# Patient Record
Sex: Female | Born: 1945 | Race: White | Hispanic: No | Smoking: Never smoker
Health system: Southern US, Community
[De-identification: ages and names within clinical notes are randomized; demographics above are authoritative.]

## PROBLEM LIST (undated history)

## (undated) DIAGNOSIS — S32020A Wedge compression fracture of second lumbar vertebra, initial encounter for closed fracture: Secondary | ICD-10-CM

## (undated) DIAGNOSIS — Z889 Allergy status to unspecified drugs, medicaments and biological substances status: Secondary | ICD-10-CM

## (undated) DIAGNOSIS — J40 Bronchitis, not specified as acute or chronic: Secondary | ICD-10-CM

## (undated) DIAGNOSIS — C8590 Non-Hodgkin lymphoma, unspecified, unspecified site: Secondary | ICD-10-CM

## (undated) DIAGNOSIS — E039 Hypothyroidism, unspecified: Secondary | ICD-10-CM

## (undated) HISTORY — PX: BACK SURGERY: SHX140

## (undated) HISTORY — PX: CHOLECYSTECTOMY: SHX55

## (undated) HISTORY — PX: BREAST ENHANCEMENT SURGERY: SHX7

## (undated) HISTORY — PX: TONSILLECTOMY: SUR1361

## (undated) HISTORY — PX: OTHER SURGICAL HISTORY: SHX169

## (undated) HISTORY — PX: APPENDECTOMY: SHX54

---

## 2012-08-24 DIAGNOSIS — J310 Chronic rhinitis: Secondary | ICD-10-CM | POA: Diagnosis not present

## 2012-08-24 DIAGNOSIS — H60399 Other infective otitis externa, unspecified ear: Secondary | ICD-10-CM | POA: Diagnosis not present

## 2012-08-24 DIAGNOSIS — M542 Cervicalgia: Secondary | ICD-10-CM | POA: Diagnosis not present

## 2012-11-23 DIAGNOSIS — M9981 Other biomechanical lesions of cervical region: Secondary | ICD-10-CM | POA: Diagnosis not present

## 2012-11-23 DIAGNOSIS — M5137 Other intervertebral disc degeneration, lumbosacral region: Secondary | ICD-10-CM | POA: Diagnosis not present

## 2012-11-23 DIAGNOSIS — J019 Acute sinusitis, unspecified: Secondary | ICD-10-CM | POA: Diagnosis not present

## 2012-11-23 DIAGNOSIS — M5030 Other cervical disc degeneration, unspecified cervical region: Secondary | ICD-10-CM | POA: Diagnosis not present

## 2012-11-23 DIAGNOSIS — S239XXA Sprain of unspecified parts of thorax, initial encounter: Secondary | ICD-10-CM | POA: Diagnosis not present

## 2012-11-23 DIAGNOSIS — M999 Biomechanical lesion, unspecified: Secondary | ICD-10-CM | POA: Diagnosis not present

## 2013-01-15 DIAGNOSIS — M9981 Other biomechanical lesions of cervical region: Secondary | ICD-10-CM | POA: Diagnosis not present

## 2013-01-15 DIAGNOSIS — S239XXA Sprain of unspecified parts of thorax, initial encounter: Secondary | ICD-10-CM | POA: Diagnosis not present

## 2013-01-15 DIAGNOSIS — M5030 Other cervical disc degeneration, unspecified cervical region: Secondary | ICD-10-CM | POA: Diagnosis not present

## 2013-01-15 DIAGNOSIS — M5137 Other intervertebral disc degeneration, lumbosacral region: Secondary | ICD-10-CM | POA: Diagnosis not present

## 2013-01-15 DIAGNOSIS — M999 Biomechanical lesion, unspecified: Secondary | ICD-10-CM | POA: Diagnosis not present

## 2013-02-07 DIAGNOSIS — J0100 Acute maxillary sinusitis, unspecified: Secondary | ICD-10-CM | POA: Diagnosis not present

## 2013-02-16 DIAGNOSIS — E8881 Metabolic syndrome: Secondary | ICD-10-CM | POA: Diagnosis not present

## 2013-02-16 DIAGNOSIS — E559 Vitamin D deficiency, unspecified: Secondary | ICD-10-CM | POA: Diagnosis not present

## 2013-02-16 DIAGNOSIS — M810 Age-related osteoporosis without current pathological fracture: Secondary | ICD-10-CM | POA: Diagnosis not present

## 2013-02-16 DIAGNOSIS — E039 Hypothyroidism, unspecified: Secondary | ICD-10-CM | POA: Diagnosis not present

## 2013-02-16 DIAGNOSIS — N951 Menopausal and female climacteric states: Secondary | ICD-10-CM | POA: Diagnosis not present

## 2013-03-08 DIAGNOSIS — Z1231 Encounter for screening mammogram for malignant neoplasm of breast: Secondary | ICD-10-CM | POA: Diagnosis not present

## 2013-03-08 DIAGNOSIS — M810 Age-related osteoporosis without current pathological fracture: Secondary | ICD-10-CM | POA: Diagnosis not present

## 2013-03-08 DIAGNOSIS — E039 Hypothyroidism, unspecified: Secondary | ICD-10-CM | POA: Diagnosis not present

## 2013-03-08 DIAGNOSIS — Z978 Presence of other specified devices: Secondary | ICD-10-CM | POA: Diagnosis not present

## 2013-03-08 DIAGNOSIS — N951 Menopausal and female climacteric states: Secondary | ICD-10-CM | POA: Diagnosis not present

## 2013-03-08 DIAGNOSIS — E8881 Metabolic syndrome: Secondary | ICD-10-CM | POA: Diagnosis not present

## 2013-03-31 DIAGNOSIS — J309 Allergic rhinitis, unspecified: Secondary | ICD-10-CM | POA: Diagnosis not present

## 2013-04-27 DIAGNOSIS — E039 Hypothyroidism, unspecified: Secondary | ICD-10-CM | POA: Diagnosis not present

## 2013-05-22 DIAGNOSIS — J209 Acute bronchitis, unspecified: Secondary | ICD-10-CM | POA: Diagnosis not present

## 2013-05-22 DIAGNOSIS — H6500 Acute serous otitis media, unspecified ear: Secondary | ICD-10-CM | POA: Diagnosis not present

## 2013-05-22 DIAGNOSIS — J0100 Acute maxillary sinusitis, unspecified: Secondary | ICD-10-CM | POA: Diagnosis not present

## 2013-06-08 DIAGNOSIS — E039 Hypothyroidism, unspecified: Secondary | ICD-10-CM | POA: Diagnosis not present

## 2013-06-08 DIAGNOSIS — R5381 Other malaise: Secondary | ICD-10-CM | POA: Diagnosis not present

## 2013-06-08 DIAGNOSIS — J309 Allergic rhinitis, unspecified: Secondary | ICD-10-CM | POA: Diagnosis not present

## 2013-06-08 DIAGNOSIS — Z800 Family history of malignant neoplasm of digestive organs: Secondary | ICD-10-CM | POA: Diagnosis not present

## 2013-08-09 DIAGNOSIS — M545 Low back pain: Secondary | ICD-10-CM | POA: Diagnosis not present

## 2013-08-09 DIAGNOSIS — M542 Cervicalgia: Secondary | ICD-10-CM | POA: Diagnosis not present

## 2013-08-09 DIAGNOSIS — M2460 Ankylosis, unspecified joint: Secondary | ICD-10-CM | POA: Diagnosis not present

## 2013-08-09 DIAGNOSIS — M999 Biomechanical lesion, unspecified: Secondary | ICD-10-CM | POA: Diagnosis not present

## 2013-08-09 DIAGNOSIS — M9981 Other biomechanical lesions of cervical region: Secondary | ICD-10-CM | POA: Diagnosis not present

## 2013-08-10 DIAGNOSIS — J019 Acute sinusitis, unspecified: Secondary | ICD-10-CM | POA: Diagnosis not present

## 2013-09-18 DIAGNOSIS — M999 Biomechanical lesion, unspecified: Secondary | ICD-10-CM | POA: Diagnosis not present

## 2013-09-18 DIAGNOSIS — M5126 Other intervertebral disc displacement, lumbar region: Secondary | ICD-10-CM | POA: Diagnosis not present

## 2013-09-20 DIAGNOSIS — M999 Biomechanical lesion, unspecified: Secondary | ICD-10-CM | POA: Diagnosis not present

## 2013-09-20 DIAGNOSIS — M5126 Other intervertebral disc displacement, lumbar region: Secondary | ICD-10-CM | POA: Diagnosis not present

## 2013-10-14 DIAGNOSIS — J019 Acute sinusitis, unspecified: Secondary | ICD-10-CM | POA: Diagnosis not present

## 2013-10-19 DIAGNOSIS — R51 Headache: Secondary | ICD-10-CM | POA: Diagnosis not present

## 2013-10-19 DIAGNOSIS — J019 Acute sinusitis, unspecified: Secondary | ICD-10-CM | POA: Diagnosis not present

## 2013-10-19 DIAGNOSIS — J310 Chronic rhinitis: Secondary | ICD-10-CM | POA: Diagnosis not present

## 2013-10-19 DIAGNOSIS — J342 Deviated nasal septum: Secondary | ICD-10-CM | POA: Diagnosis not present

## 2013-11-02 DIAGNOSIS — E559 Vitamin D deficiency, unspecified: Secondary | ICD-10-CM | POA: Diagnosis not present

## 2013-11-02 DIAGNOSIS — E7800 Pure hypercholesterolemia, unspecified: Secondary | ICD-10-CM | POA: Diagnosis not present

## 2013-11-02 DIAGNOSIS — Z79899 Other long term (current) drug therapy: Secondary | ICD-10-CM | POA: Diagnosis not present

## 2013-11-02 DIAGNOSIS — E039 Hypothyroidism, unspecified: Secondary | ICD-10-CM | POA: Diagnosis not present

## 2013-11-02 DIAGNOSIS — R5381 Other malaise: Secondary | ICD-10-CM | POA: Diagnosis not present

## 2013-11-21 DIAGNOSIS — Z800 Family history of malignant neoplasm of digestive organs: Secondary | ICD-10-CM | POA: Diagnosis not present

## 2013-11-21 DIAGNOSIS — Z86010 Personal history of colonic polyps: Secondary | ICD-10-CM | POA: Diagnosis not present

## 2013-11-21 DIAGNOSIS — R1011 Right upper quadrant pain: Secondary | ICD-10-CM | POA: Diagnosis not present

## 2013-12-07 DIAGNOSIS — E039 Hypothyroidism, unspecified: Secondary | ICD-10-CM | POA: Diagnosis not present

## 2013-12-07 DIAGNOSIS — Z800 Family history of malignant neoplasm of digestive organs: Secondary | ICD-10-CM | POA: Diagnosis not present

## 2013-12-07 DIAGNOSIS — R1031 Right lower quadrant pain: Secondary | ICD-10-CM | POA: Diagnosis not present

## 2013-12-07 DIAGNOSIS — Z860100 Personal history of colon polyps, unspecified: Secondary | ICD-10-CM | POA: Diagnosis not present

## 2013-12-07 DIAGNOSIS — Z87898 Personal history of other specified conditions: Secondary | ICD-10-CM | POA: Diagnosis not present

## 2013-12-07 DIAGNOSIS — D126 Benign neoplasm of colon, unspecified: Secondary | ICD-10-CM | POA: Diagnosis not present

## 2013-12-07 DIAGNOSIS — K5900 Constipation, unspecified: Secondary | ICD-10-CM | POA: Diagnosis not present

## 2013-12-07 DIAGNOSIS — F329 Major depressive disorder, single episode, unspecified: Secondary | ICD-10-CM | POA: Diagnosis not present

## 2013-12-07 DIAGNOSIS — F3289 Other specified depressive episodes: Secondary | ICD-10-CM | POA: Diagnosis not present

## 2013-12-07 DIAGNOSIS — R5381 Other malaise: Secondary | ICD-10-CM | POA: Diagnosis not present

## 2013-12-07 DIAGNOSIS — R141 Gas pain: Secondary | ICD-10-CM | POA: Diagnosis not present

## 2013-12-07 DIAGNOSIS — Z79899 Other long term (current) drug therapy: Secondary | ICD-10-CM | POA: Diagnosis not present

## 2013-12-07 DIAGNOSIS — R635 Abnormal weight gain: Secondary | ICD-10-CM | POA: Diagnosis not present

## 2014-01-01 DIAGNOSIS — R5381 Other malaise: Secondary | ICD-10-CM | POA: Diagnosis not present

## 2014-01-01 DIAGNOSIS — E7800 Pure hypercholesterolemia, unspecified: Secondary | ICD-10-CM | POA: Diagnosis not present

## 2014-01-01 DIAGNOSIS — R5383 Other fatigue: Secondary | ICD-10-CM | POA: Diagnosis not present

## 2014-01-08 DIAGNOSIS — F331 Major depressive disorder, recurrent, moderate: Secondary | ICD-10-CM | POA: Diagnosis not present

## 2014-01-08 DIAGNOSIS — F330 Major depressive disorder, recurrent, mild: Secondary | ICD-10-CM | POA: Diagnosis not present

## 2014-01-08 DIAGNOSIS — R6250 Unspecified lack of expected normal physiological development in childhood: Secondary | ICD-10-CM | POA: Diagnosis not present

## 2014-01-08 DIAGNOSIS — F0781 Postconcussional syndrome: Secondary | ICD-10-CM | POA: Diagnosis not present

## 2014-03-22 DIAGNOSIS — S7000XA Contusion of unspecified hip, initial encounter: Secondary | ICD-10-CM | POA: Diagnosis not present

## 2014-03-22 DIAGNOSIS — N390 Urinary tract infection, site not specified: Secondary | ICD-10-CM | POA: Diagnosis not present

## 2014-04-22 DIAGNOSIS — N390 Urinary tract infection, site not specified: Secondary | ICD-10-CM | POA: Diagnosis not present

## 2014-04-22 DIAGNOSIS — N760 Acute vaginitis: Secondary | ICD-10-CM | POA: Diagnosis not present

## 2014-04-22 DIAGNOSIS — N23 Unspecified renal colic: Secondary | ICD-10-CM | POA: Diagnosis not present

## 2014-04-22 DIAGNOSIS — N200 Calculus of kidney: Secondary | ICD-10-CM | POA: Diagnosis not present

## 2014-04-22 DIAGNOSIS — R339 Retention of urine, unspecified: Secondary | ICD-10-CM | POA: Diagnosis not present

## 2014-04-29 DIAGNOSIS — N23 Unspecified renal colic: Secondary | ICD-10-CM | POA: Diagnosis not present

## 2014-04-29 DIAGNOSIS — N390 Urinary tract infection, site not specified: Secondary | ICD-10-CM | POA: Diagnosis not present

## 2014-05-13 DIAGNOSIS — R339 Retention of urine, unspecified: Secondary | ICD-10-CM | POA: Diagnosis not present

## 2014-05-13 DIAGNOSIS — N23 Unspecified renal colic: Secondary | ICD-10-CM | POA: Diagnosis not present

## 2014-05-13 DIAGNOSIS — N760 Acute vaginitis: Secondary | ICD-10-CM | POA: Diagnosis not present

## 2014-05-13 DIAGNOSIS — N390 Urinary tract infection, site not specified: Secondary | ICD-10-CM | POA: Diagnosis not present

## 2014-05-13 DIAGNOSIS — R319 Hematuria, unspecified: Secondary | ICD-10-CM | POA: Diagnosis not present

## 2014-05-13 DIAGNOSIS — N200 Calculus of kidney: Secondary | ICD-10-CM | POA: Diagnosis not present

## 2014-05-14 DIAGNOSIS — R5381 Other malaise: Secondary | ICD-10-CM | POA: Diagnosis not present

## 2014-05-14 DIAGNOSIS — M549 Dorsalgia, unspecified: Secondary | ICD-10-CM | POA: Diagnosis not present

## 2014-05-14 DIAGNOSIS — R5383 Other fatigue: Secondary | ICD-10-CM | POA: Diagnosis not present

## 2014-05-14 DIAGNOSIS — E039 Hypothyroidism, unspecified: Secondary | ICD-10-CM | POA: Diagnosis not present

## 2014-05-27 DIAGNOSIS — N35919 Unspecified urethral stricture, male, unspecified site: Secondary | ICD-10-CM | POA: Diagnosis not present

## 2014-05-27 DIAGNOSIS — N362 Urethral caruncle: Secondary | ICD-10-CM | POA: Diagnosis not present

## 2014-05-27 DIAGNOSIS — N390 Urinary tract infection, site not specified: Secondary | ICD-10-CM | POA: Diagnosis not present

## 2014-05-27 DIAGNOSIS — R339 Retention of urine, unspecified: Secondary | ICD-10-CM | POA: Diagnosis not present

## 2014-05-27 DIAGNOSIS — R319 Hematuria, unspecified: Secondary | ICD-10-CM | POA: Diagnosis not present

## 2014-05-27 DIAGNOSIS — R310 Gross hematuria: Secondary | ICD-10-CM | POA: Diagnosis not present

## 2014-06-17 DIAGNOSIS — T148 Other injury of unspecified body region: Secondary | ICD-10-CM | POA: Diagnosis not present

## 2014-06-17 DIAGNOSIS — M25559 Pain in unspecified hip: Secondary | ICD-10-CM | POA: Diagnosis not present

## 2014-06-17 DIAGNOSIS — E039 Hypothyroidism, unspecified: Secondary | ICD-10-CM | POA: Diagnosis not present

## 2014-06-17 DIAGNOSIS — N390 Urinary tract infection, site not specified: Secondary | ICD-10-CM | POA: Diagnosis not present

## 2014-06-17 DIAGNOSIS — K5900 Constipation, unspecified: Secondary | ICD-10-CM | POA: Diagnosis not present

## 2014-06-17 DIAGNOSIS — Z23 Encounter for immunization: Secondary | ICD-10-CM | POA: Diagnosis not present

## 2014-06-17 DIAGNOSIS — E278 Other specified disorders of adrenal gland: Secondary | ICD-10-CM | POA: Diagnosis not present

## 2014-07-16 DIAGNOSIS — H02423 Myogenic ptosis of bilateral eyelids: Secondary | ICD-10-CM | POA: Diagnosis not present

## 2014-07-16 DIAGNOSIS — H02831 Dermatochalasis of right upper eyelid: Secondary | ICD-10-CM | POA: Diagnosis not present

## 2014-07-16 DIAGNOSIS — H02834 Dermatochalasis of left upper eyelid: Secondary | ICD-10-CM | POA: Diagnosis not present

## 2014-08-13 DIAGNOSIS — N390 Urinary tract infection, site not specified: Secondary | ICD-10-CM | POA: Diagnosis not present

## 2014-09-12 DIAGNOSIS — H02834 Dermatochalasis of left upper eyelid: Secondary | ICD-10-CM | POA: Diagnosis not present

## 2014-09-12 DIAGNOSIS — H01116 Allergic dermatitis of left eye, unspecified eyelid: Secondary | ICD-10-CM | POA: Diagnosis not present

## 2014-09-12 DIAGNOSIS — H02831 Dermatochalasis of right upper eyelid: Secondary | ICD-10-CM | POA: Diagnosis not present

## 2014-09-12 DIAGNOSIS — H01113 Allergic dermatitis of right eye, unspecified eyelid: Secondary | ICD-10-CM | POA: Diagnosis not present

## 2014-09-17 DIAGNOSIS — N760 Acute vaginitis: Secondary | ICD-10-CM | POA: Diagnosis not present

## 2014-09-17 DIAGNOSIS — R3914 Feeling of incomplete bladder emptying: Secondary | ICD-10-CM | POA: Diagnosis not present

## 2014-09-17 DIAGNOSIS — N390 Urinary tract infection, site not specified: Secondary | ICD-10-CM | POA: Diagnosis not present

## 2014-09-17 DIAGNOSIS — N23 Unspecified renal colic: Secondary | ICD-10-CM | POA: Diagnosis not present

## 2014-09-17 DIAGNOSIS — N200 Calculus of kidney: Secondary | ICD-10-CM | POA: Diagnosis not present

## 2014-09-17 DIAGNOSIS — Q6101 Congenital single renal cyst: Secondary | ICD-10-CM | POA: Diagnosis not present

## 2014-11-11 DIAGNOSIS — H02831 Dermatochalasis of right upper eyelid: Secondary | ICD-10-CM | POA: Diagnosis not present

## 2014-11-11 DIAGNOSIS — H02834 Dermatochalasis of left upper eyelid: Secondary | ICD-10-CM | POA: Diagnosis not present

## 2014-12-04 DIAGNOSIS — N390 Urinary tract infection, site not specified: Secondary | ICD-10-CM | POA: Diagnosis not present

## 2015-01-13 DIAGNOSIS — M25551 Pain in right hip: Secondary | ICD-10-CM | POA: Diagnosis not present

## 2015-01-13 DIAGNOSIS — N760 Acute vaginitis: Secondary | ICD-10-CM | POA: Diagnosis not present

## 2015-01-13 DIAGNOSIS — N390 Urinary tract infection, site not specified: Secondary | ICD-10-CM | POA: Diagnosis not present

## 2015-01-13 DIAGNOSIS — R3914 Feeling of incomplete bladder emptying: Secondary | ICD-10-CM | POA: Diagnosis not present

## 2015-01-13 DIAGNOSIS — R319 Hematuria, unspecified: Secondary | ICD-10-CM | POA: Diagnosis not present

## 2015-01-13 DIAGNOSIS — N200 Calculus of kidney: Secondary | ICD-10-CM | POA: Diagnosis not present

## 2015-03-11 DIAGNOSIS — E039 Hypothyroidism, unspecified: Secondary | ICD-10-CM | POA: Diagnosis not present

## 2015-03-11 DIAGNOSIS — Z23 Encounter for immunization: Secondary | ICD-10-CM | POA: Diagnosis not present

## 2015-03-11 DIAGNOSIS — M25559 Pain in unspecified hip: Secondary | ICD-10-CM | POA: Diagnosis not present

## 2015-03-11 DIAGNOSIS — Z136 Encounter for screening for cardiovascular disorders: Secondary | ICD-10-CM | POA: Diagnosis not present

## 2015-03-11 DIAGNOSIS — L298 Other pruritus: Secondary | ICD-10-CM | POA: Diagnosis not present

## 2015-03-11 DIAGNOSIS — Z0000 Encounter for general adult medical examination without abnormal findings: Secondary | ICD-10-CM | POA: Diagnosis not present

## 2015-03-11 DIAGNOSIS — F329 Major depressive disorder, single episode, unspecified: Secondary | ICD-10-CM | POA: Diagnosis not present

## 2015-04-23 DIAGNOSIS — J019 Acute sinusitis, unspecified: Secondary | ICD-10-CM | POA: Diagnosis not present

## 2015-04-23 DIAGNOSIS — H65199 Other acute nonsuppurative otitis media, unspecified ear: Secondary | ICD-10-CM | POA: Diagnosis not present

## 2015-09-03 DIAGNOSIS — G4700 Insomnia, unspecified: Secondary | ICD-10-CM | POA: Diagnosis not present

## 2015-09-03 DIAGNOSIS — L821 Other seborrheic keratosis: Secondary | ICD-10-CM | POA: Diagnosis not present

## 2015-09-03 DIAGNOSIS — E039 Hypothyroidism, unspecified: Secondary | ICD-10-CM | POA: Diagnosis not present

## 2015-09-03 DIAGNOSIS — F329 Major depressive disorder, single episode, unspecified: Secondary | ICD-10-CM | POA: Diagnosis not present

## 2015-09-16 DIAGNOSIS — J019 Acute sinusitis, unspecified: Secondary | ICD-10-CM | POA: Diagnosis not present

## 2015-09-16 DIAGNOSIS — J209 Acute bronchitis, unspecified: Secondary | ICD-10-CM | POA: Diagnosis not present

## 2016-02-27 DIAGNOSIS — M542 Cervicalgia: Secondary | ICD-10-CM | POA: Diagnosis not present

## 2016-02-27 DIAGNOSIS — M549 Dorsalgia, unspecified: Secondary | ICD-10-CM | POA: Diagnosis not present

## 2016-02-27 DIAGNOSIS — M545 Low back pain: Secondary | ICD-10-CM | POA: Diagnosis not present

## 2016-02-28 DIAGNOSIS — M549 Dorsalgia, unspecified: Secondary | ICD-10-CM | POA: Diagnosis not present

## 2016-03-03 DIAGNOSIS — M545 Low back pain: Secondary | ICD-10-CM | POA: Diagnosis not present

## 2016-03-06 DIAGNOSIS — M545 Low back pain: Secondary | ICD-10-CM | POA: Diagnosis not present

## 2016-03-17 DIAGNOSIS — R0602 Shortness of breath: Secondary | ICD-10-CM | POA: Diagnosis not present

## 2016-03-17 DIAGNOSIS — J209 Acute bronchitis, unspecified: Secondary | ICD-10-CM | POA: Diagnosis not present

## 2016-03-17 DIAGNOSIS — I4892 Unspecified atrial flutter: Secondary | ICD-10-CM | POA: Diagnosis not present

## 2016-03-22 DIAGNOSIS — M545 Low back pain: Secondary | ICD-10-CM | POA: Diagnosis not present

## 2016-03-24 DIAGNOSIS — E039 Hypothyroidism, unspecified: Secondary | ICD-10-CM | POA: Diagnosis not present

## 2016-03-24 DIAGNOSIS — F329 Major depressive disorder, single episode, unspecified: Secondary | ICD-10-CM | POA: Diagnosis not present

## 2016-03-24 DIAGNOSIS — J329 Chronic sinusitis, unspecified: Secondary | ICD-10-CM | POA: Diagnosis not present

## 2016-05-04 DIAGNOSIS — M545 Low back pain: Secondary | ICD-10-CM | POA: Diagnosis not present

## 2016-05-06 ENCOUNTER — Other Ambulatory Visit: Payer: Self-pay

## 2016-05-10 DIAGNOSIS — H6691 Otitis media, unspecified, right ear: Secondary | ICD-10-CM | POA: Diagnosis not present

## 2016-05-10 DIAGNOSIS — J209 Acute bronchitis, unspecified: Secondary | ICD-10-CM | POA: Diagnosis not present

## 2016-05-10 DIAGNOSIS — J019 Acute sinusitis, unspecified: Secondary | ICD-10-CM | POA: Diagnosis not present

## 2016-05-24 DIAGNOSIS — M545 Low back pain: Secondary | ICD-10-CM | POA: Diagnosis not present

## 2016-06-01 ENCOUNTER — Other Ambulatory Visit: Payer: Self-pay | Admitting: Orthopedic Surgery

## 2016-06-02 ENCOUNTER — Encounter (HOSPITAL_COMMUNITY): Payer: Self-pay | Admitting: *Deleted

## 2016-06-02 NOTE — Progress Notes (Signed)
Pt denies SOB, chest pain, and being under the care of a cardiologist. Pt denies having a cardiac cath and stress test but stated that an echo was performed 20 years ago. Pt stated that an EKG and chest x ray was done at [facility] in [place], [place]; records requested. Pt denies having any recent labs. Pt made aware to stop  taking Aspirin, vitamins, fish oil and herbal medications. Do not take any NSAIDs ie: Ibuprofen, Advil, Naproxen, BC and Goody Powder or any medication containing Aspirin. Pt verbalized understanding of all pre-op instructions.

## 2016-06-03 ENCOUNTER — Encounter (HOSPITAL_COMMUNITY): Payer: Self-pay | Admitting: *Deleted

## 2016-06-03 ENCOUNTER — Ambulatory Visit (HOSPITAL_COMMUNITY): Payer: Medicare Other

## 2016-06-03 ENCOUNTER — Encounter (HOSPITAL_COMMUNITY)
Admission: RE | Disposition: A | Discharge status: Home / Self Care | Payer: Self-pay | Source: Ambulatory Visit | Attending: Orthopedic Surgery

## 2016-06-03 ENCOUNTER — Ambulatory Visit (HOSPITAL_COMMUNITY): Payer: Medicare Other | Admitting: Anesthesiology

## 2016-06-03 ENCOUNTER — Ambulatory Visit (HOSPITAL_COMMUNITY)
Admission: RE | Admit: 2016-06-03 | Discharge: 2016-06-03 | Disposition: A | Discharge status: Home / Self Care | Payer: Medicare Other | Source: Ambulatory Visit | Attending: Orthopedic Surgery | Admitting: Orthopedic Surgery

## 2016-06-03 DIAGNOSIS — E039 Hypothyroidism, unspecified: Secondary | ICD-10-CM | POA: Insufficient documentation

## 2016-06-03 DIAGNOSIS — S32020A Wedge compression fracture of second lumbar vertebra, initial encounter for closed fracture: Secondary | ICD-10-CM | POA: Diagnosis not present

## 2016-06-03 DIAGNOSIS — W19XXXA Unspecified fall, initial encounter: Secondary | ICD-10-CM | POA: Diagnosis not present

## 2016-06-03 DIAGNOSIS — Z825 Family history of asthma and other chronic lower respiratory diseases: Secondary | ICD-10-CM | POA: Insufficient documentation

## 2016-06-03 DIAGNOSIS — Z833 Family history of diabetes mellitus: Secondary | ICD-10-CM | POA: Diagnosis not present

## 2016-06-03 DIAGNOSIS — Z8572 Personal history of non-Hodgkin lymphomas: Secondary | ICD-10-CM | POA: Diagnosis not present

## 2016-06-03 DIAGNOSIS — Z419 Encounter for procedure for purposes other than remedying health state, unspecified: Secondary | ICD-10-CM

## 2016-06-03 DIAGNOSIS — Z01818 Encounter for other preprocedural examination: Secondary | ICD-10-CM

## 2016-06-03 DIAGNOSIS — C8590 Non-Hodgkin lymphoma, unspecified, unspecified site: Secondary | ICD-10-CM | POA: Diagnosis not present

## 2016-06-03 DIAGNOSIS — S32028A Other fracture of second lumbar vertebra, initial encounter for closed fracture: Secondary | ICD-10-CM | POA: Diagnosis not present

## 2016-06-03 DIAGNOSIS — Z462 Encounter for fitting and adjustment of other devices related to nervous system and special senses: Secondary | ICD-10-CM | POA: Diagnosis not present

## 2016-06-03 HISTORY — DX: Hypothyroidism, unspecified: E03.9

## 2016-06-03 HISTORY — DX: Non-Hodgkin lymphoma, unspecified, unspecified site: C85.90

## 2016-06-03 HISTORY — PX: KYPHOPLASTY: SHX5884

## 2016-06-03 HISTORY — DX: Bronchitis, not specified as acute or chronic: J40

## 2016-06-03 HISTORY — DX: Wedge compression fracture of second lumbar vertebra, initial encounter for closed fracture: S32.020A

## 2016-06-03 HISTORY — DX: Allergy status to unspecified drugs, medicaments and biological substances: Z88.9

## 2016-06-03 LAB — COMPREHENSIVE METABOLIC PANEL
ALT: 15 U/L (ref 14–54)
AST: 19 U/L (ref 15–41)
Albumin: 3.8 g/dL (ref 3.5–5.0)
Alkaline Phosphatase: 59 U/L (ref 38–126)
Anion gap: 7 (ref 5–15)
BUN: 14 mg/dL (ref 6–20)
CHLORIDE: 110 mmol/L (ref 101–111)
CO2: 24 mmol/L (ref 22–32)
Calcium: 9.3 mg/dL (ref 8.9–10.3)
Creatinine, Ser: 0.74 mg/dL (ref 0.44–1.00)
Glucose, Bld: 99 mg/dL (ref 65–99)
POTASSIUM: 4.3 mmol/L (ref 3.5–5.1)
SODIUM: 141 mmol/L (ref 135–145)
Total Bilirubin: 0.8 mg/dL (ref 0.3–1.2)
Total Protein: 6.4 g/dL — ABNORMAL LOW (ref 6.5–8.1)

## 2016-06-03 LAB — CBC WITH DIFFERENTIAL/PLATELET
Basophils Absolute: 0 10*3/uL (ref 0.0–0.1)
Basophils Relative: 1 %
EOS ABS: 0.2 10*3/uL (ref 0.0–0.7)
EOS PCT: 4 %
HCT: 37.5 % (ref 36.0–46.0)
HEMOGLOBIN: 12.7 g/dL (ref 12.0–15.0)
LYMPHS ABS: 2.2 10*3/uL (ref 0.7–4.0)
Lymphocytes Relative: 39 %
MCH: 32.6 pg (ref 26.0–34.0)
MCHC: 33.9 g/dL (ref 30.0–36.0)
MCV: 96.4 fL (ref 78.0–100.0)
MONO ABS: 0.5 10*3/uL (ref 0.1–1.0)
MONOS PCT: 9 %
Neutro Abs: 2.8 10*3/uL (ref 1.7–7.7)
Neutrophils Relative %: 47 %
PLATELETS: 264 10*3/uL (ref 150–400)
RBC: 3.89 MIL/uL (ref 3.87–5.11)
RDW: 12.2 % (ref 11.5–15.5)
WBC: 5.8 10*3/uL (ref 4.0–10.5)

## 2016-06-03 LAB — URINALYSIS, ROUTINE W REFLEX MICROSCOPIC
BILIRUBIN URINE: NEGATIVE
GLUCOSE, UA: NEGATIVE mg/dL
HGB URINE DIPSTICK: NEGATIVE
KETONES UR: 15 mg/dL — AB
Leukocytes, UA: NEGATIVE
Nitrite: NEGATIVE
PH: 7.5 (ref 5.0–8.0)
Protein, ur: NEGATIVE mg/dL
Specific Gravity, Urine: 1.025 (ref 1.005–1.030)

## 2016-06-03 LAB — PROTIME-INR
INR: 0.99
PROTHROMBIN TIME: 13.1 s (ref 11.4–15.2)

## 2016-06-03 LAB — APTT: aPTT: 30 seconds (ref 24–36)

## 2016-06-03 SURGERY — KYPHOPLASTY
Anesthesia: General

## 2016-06-03 MED ORDER — SUGAMMADEX SODIUM 200 MG/2ML IV SOLN
INTRAVENOUS | Status: AC
  Filled 2016-06-03: qty 2

## 2016-06-03 MED ORDER — BACITRACIN 500 UNIT/GM EX OINT
TOPICAL_OINTMENT | CUTANEOUS | Status: DC | PRN
  Administered 2016-06-03: 1 via TOPICAL

## 2016-06-03 MED ORDER — IOPAMIDOL (ISOVUE-300) INJECTION 61%
INTRAVENOUS | Status: AC
  Filled 2016-06-03: qty 50

## 2016-06-03 MED ORDER — LIDOCAINE HCL (CARDIAC) 20 MG/ML IV SOLN
INTRAVENOUS | Status: DC | PRN
  Administered 2016-06-03: 100 mg via INTRAVENOUS

## 2016-06-03 MED ORDER — FENTANYL CITRATE (PF) 100 MCG/2ML IJ SOLN
INTRAMUSCULAR | Status: DC | PRN
  Administered 2016-06-03 (×2): 50 ug via INTRAVENOUS

## 2016-06-03 MED ORDER — ROCURONIUM BROMIDE 10 MG/ML (PF) SYRINGE
PREFILLED_SYRINGE | INTRAVENOUS | Status: AC
  Filled 2016-06-03: qty 10

## 2016-06-03 MED ORDER — ACETAMINOPHEN 160 MG/5ML PO SOLN
325.0000 mg | ORAL | Status: DC | PRN
Start: 2016-06-03 — End: 2016-06-03
  Filled 2016-06-03: qty 20.3

## 2016-06-03 MED ORDER — SUGAMMADEX SODIUM 200 MG/2ML IV SOLN
INTRAVENOUS | Status: DC | PRN
  Administered 2016-06-03: 145.2 mg via INTRAVENOUS

## 2016-06-03 MED ORDER — BUPIVACAINE-EPINEPHRINE 0.25% -1:200000 IJ SOLN: INTRAMUSCULAR | Status: DC | PRN

## 2016-06-03 MED ORDER — ACETAMINOPHEN 325 MG PO TABS
325.0000 mg | ORAL_TABLET | ORAL | Status: DC | PRN
Start: 2016-06-03 — End: 2016-06-03

## 2016-06-03 MED ORDER — OXYCODONE HCL 5 MG/5ML PO SOLN: 5.0000 mg | Freq: Once | ORAL | Status: DC | PRN

## 2016-06-03 MED ORDER — CEFAZOLIN SODIUM-DEXTROSE 2-4 GM/100ML-% IV SOLN
INTRAVENOUS | Status: AC
  Filled 2016-06-03: qty 100

## 2016-06-03 MED ORDER — OXYCODONE HCL 5 MG PO TABS: 5.0000 mg | ORAL_TABLET | Freq: Once | ORAL | Status: DC | PRN

## 2016-06-03 MED ORDER — ONDANSETRON HCL 4 MG/2ML IJ SOLN
INTRAMUSCULAR | Status: DC | PRN
  Administered 2016-06-03: 4 mg via INTRAVENOUS

## 2016-06-03 MED ORDER — ARTIFICIAL TEARS OP OINT
TOPICAL_OINTMENT | OPHTHALMIC | Status: DC | PRN
  Administered 2016-06-03: 1 via OPHTHALMIC

## 2016-06-03 MED ORDER — PROPOFOL 10 MG/ML IV BOLUS
INTRAVENOUS | Status: DC | PRN
  Administered 2016-06-03: 150 mg via INTRAVENOUS

## 2016-06-03 MED ORDER — HYDROMORPHONE HCL 1 MG/ML IJ SOLN
INTRAMUSCULAR | Status: AC
  Administered 2016-06-03: 0.5 mg via INTRAVENOUS
  Filled 2016-06-03: qty 1

## 2016-06-03 MED ORDER — FENTANYL CITRATE (PF) 100 MCG/2ML IJ SOLN
INTRAMUSCULAR | Status: AC
  Filled 2016-06-03: qty 2

## 2016-06-03 MED ORDER — IOPAMIDOL (ISOVUE-300) INJECTION 61%
INTRAVENOUS | Status: DC | PRN
  Administered 2016-06-03: 30 mL

## 2016-06-03 MED ORDER — FENTANYL CITRATE (PF) 100 MCG/2ML IJ SOLN: 25.0000 ug | INTRAMUSCULAR | Status: DC | PRN

## 2016-06-03 MED ORDER — ONDANSETRON HCL 4 MG/2ML IJ SOLN
INTRAMUSCULAR | Status: AC
  Filled 2016-06-03: qty 2

## 2016-06-03 MED ORDER — LACTATED RINGERS IV SOLN
INTRAVENOUS | Status: DC
  Administered 2016-06-03 (×2): via INTRAVENOUS

## 2016-06-03 MED ORDER — BUPIVACAINE-EPINEPHRINE 0.25% -1:200000 IJ SOLN
INTRAMUSCULAR | Status: AC
  Filled 2016-06-03: qty 1

## 2016-06-03 MED ORDER — CEFAZOLIN SODIUM-DEXTROSE 2-4 GM/100ML-% IV SOLN
2.0000 g | INTRAVENOUS | Status: AC
  Administered 2016-06-03: 2 g via INTRAVENOUS

## 2016-06-03 MED ORDER — MIDAZOLAM HCL 5 MG/5ML IJ SOLN
INTRAMUSCULAR | Status: DC | PRN
  Administered 2016-06-03: 2 mg via INTRAVENOUS

## 2016-06-03 MED ORDER — BACITRACIN ZINC 500 UNIT/GM EX OINT
TOPICAL_OINTMENT | CUTANEOUS | Status: AC
  Filled 2016-06-03: qty 28.35

## 2016-06-03 MED ORDER — 0.9 % SODIUM CHLORIDE (POUR BTL) OPTIME
TOPICAL | Status: DC | PRN
  Administered 2016-06-03: 1000 mL

## 2016-06-03 MED ORDER — HYDROMORPHONE HCL 1 MG/ML IJ SOLN
0.5000 mg | INTRAMUSCULAR | Status: AC | PRN
  Administered 2016-06-03 (×4): 0.5 mg via INTRAVENOUS

## 2016-06-03 MED ORDER — PHENYLEPHRINE HCL 10 MG/ML IJ SOLN
INTRAMUSCULAR | Status: DC | PRN
  Administered 2016-06-03 (×3): 40 ug via INTRAVENOUS

## 2016-06-03 MED ORDER — ROCURONIUM BROMIDE 100 MG/10ML IV SOLN
INTRAVENOUS | Status: DC | PRN
  Administered 2016-06-03: 50 mg via INTRAVENOUS

## 2016-06-03 MED ORDER — PROPOFOL 10 MG/ML IV BOLUS
INTRAVENOUS | Status: AC
  Filled 2016-06-03: qty 20

## 2016-06-03 MED ORDER — LIDOCAINE 2% (20 MG/ML) 5 ML SYRINGE
INTRAMUSCULAR | Status: AC
  Filled 2016-06-03: qty 5

## 2016-06-03 MED ORDER — POVIDONE-IODINE 7.5 % EX SOLN
Freq: Once | CUTANEOUS | Status: DC
  Filled 2016-06-03: qty 118

## 2016-06-03 SURGICAL SUPPLY — 42 items
BLADE SURG 15 STRL LF DISP TIS (BLADE) ×1 IMPLANT
BLADE SURG 15 STRL SS (BLADE) ×1
CEMENT BONE KYPHX HV R (Orthopedic Implant) ×2 IMPLANT
CEMENT KYPHON C01A KIT/MIXER (Cement) ×2 IMPLANT
COVER MAYO STAND STRL (DRAPES) ×2 IMPLANT
COVER SURGICAL LIGHT HANDLE (MISCELLANEOUS) ×2 IMPLANT
CURETTE EXPRESS SZ2 7MM (INSTRUMENTS) ×1 IMPLANT
CURRETTE EXPRESS SZ2 7MM (INSTRUMENTS) ×2
DRAPE C-ARM 42X72 X-RAY (DRAPES) ×2 IMPLANT
DRAPE INCISE IOBAN 66X45 STRL (DRAPES) ×2 IMPLANT
DRAPE LAPAROTOMY T 102X78X121 (DRAPES) ×2 IMPLANT
DRAPE PROXIMA HALF (DRAPES) IMPLANT
DRAPE SURG 17X23 STRL (DRAPES) ×8 IMPLANT
DURAPREP 26ML APPLICATOR (WOUND CARE) ×2 IMPLANT
GAUZE SPONGE 4X4 16PLY XRAY LF (GAUZE/BANDAGES/DRESSINGS) ×2 IMPLANT
GLOVE BIO SURGEON STRL SZ7 (GLOVE) ×2 IMPLANT
GLOVE BIO SURGEON STRL SZ8 (GLOVE) ×2 IMPLANT
GLOVE BIOGEL PI IND STRL 7.0 (GLOVE) ×1 IMPLANT
GLOVE BIOGEL PI IND STRL 8 (GLOVE) ×1 IMPLANT
GLOVE BIOGEL PI INDICATOR 7.0 (GLOVE) ×1
GLOVE BIOGEL PI INDICATOR 8 (GLOVE) ×1
GOWN STRL REUS W/ TWL LRG LVL3 (GOWN DISPOSABLE) ×2 IMPLANT
GOWN STRL REUS W/ TWL XL LVL3 (GOWN DISPOSABLE) ×1 IMPLANT
GOWN STRL REUS W/TWL LRG LVL3 (GOWN DISPOSABLE) ×2
GOWN STRL REUS W/TWL XL LVL3 (GOWN DISPOSABLE) ×1
KIT BASIN OR (CUSTOM PROCEDURE TRAY) ×2 IMPLANT
KIT ROOM TURNOVER OR (KITS) ×2 IMPLANT
NEEDLE 22X1 1/2 (OR ONLY) (NEEDLE) IMPLANT
NEEDLE HYPO 25X1 1.5 SAFETY (NEEDLE) IMPLANT
NEEDLE SPNL 18GX3.5 QUINCKE PK (NEEDLE) ×6 IMPLANT
NS IRRIG 1000ML POUR BTL (IV SOLUTION) ×2 IMPLANT
PACK SURGICAL SETUP 50X90 (CUSTOM PROCEDURE TRAY) ×2 IMPLANT
PAD ARMBOARD 7.5X6 YLW CONV (MISCELLANEOUS) ×4 IMPLANT
POSITIONER HEAD PRONE TRACH (MISCELLANEOUS) ×2 IMPLANT
SPONGE GAUZE 4X4 12PLY STER LF (GAUZE/BANDAGES/DRESSINGS) ×2 IMPLANT
SUT MNCRL AB 4-0 PS2 18 (SUTURE) ×2 IMPLANT
SYR BULB IRRIGATION 50ML (SYRINGE) ×2 IMPLANT
SYR CONTROL 10ML LL (SYRINGE) ×2 IMPLANT
TAPE CLOTH SURG 4X10 WHT LF (GAUZE/BANDAGES/DRESSINGS) ×2 IMPLANT
TOWEL OR 17X24 6PK STRL BLUE (TOWEL DISPOSABLE) ×2 IMPLANT
TOWEL OR 17X26 10 PK STRL BLUE (TOWEL DISPOSABLE) ×2 IMPLANT
TRAY KYPHOPAK 15/3 ONESTEP 1ST (MISCELLANEOUS) IMPLANT

## 2016-06-03 NOTE — Anesthesia Procedure Notes (Signed)
Procedure Name: Intubation Date/Time: 06/03/2016 12:31 PM Performed by: Scheryl Darter Pre-anesthesia Checklist: Patient identified, Emergency Drugs available, Suction available and Patient being monitored Patient Re-evaluated:Patient Re-evaluated prior to inductionOxygen Delivery Method: Circle System Utilized Preoxygenation: Pre-oxygenation with 100% oxygen Intubation Type: IV induction Ventilation: Mask ventilation without difficulty Laryngoscope Size: Miller and 2 Grade View: Grade I Tube type: Oral Tube size: 7.5 mm Number of attempts: 1 Airway Equipment and Method: Stylet and Oral airway Placement Confirmation: ETT inserted through vocal cords under direct vision,  positive ETCO2 and breath sounds checked- equal and bilateral Secured at: 21 cm Tube secured with: Tape Dental Injury: Teeth and Oropharynx as per pre-operative assessment  Comments: Teeth unchanged

## 2016-06-03 NOTE — H&P (Signed)
     PREOPERATIVE H&P  Chief Complaint: Low back pain  HPI: Shelly Romero is a 70 y.o. female who presents with ongoing pain in the low back x 3 months  MRI reveals edema at the L2 vertebral body  Patient has failed multiple forms of conservative care and continues to have pain (see office notes for additional details regarding the patient's full course of treatment)  Past Medical History:  Diagnosis Date  . Bronchitis   . Compression fracture of L2 lumbar vertebra (HCC)   . History of seasonal allergies   . Hypothyroidism   . Non Hodgkin's lymphoma [facility])    Past Surgical History:  Procedure Laterality Date  . APPENDECTOMY    . BACK SURGERY     cervical  . BREAST ENHANCEMENT SURGERY    . chin lift    . CHOLECYSTECTOMY    . ovary excision     due to Non-hodgkins lymphoma  . TONSILLECTOMY     Social History   Social History  . Marital status: Divorced    Spouse name: N/A  . Number of children: N/A  . Years of education: N/A   Social History Main Topics  . Smoking status: Never Smoker  . Smokeless tobacco: Never Used  . Alcohol use Yes     Comment: occasional glass of wine ( 3 days a  week)  . Drug use: No  . Sexual activity: Not Asked   Other Topics Concern  . None   Social History Narrative  . None   Family History  Problem Relation Age of Onset  . Diabetes Mother   . COPD Sister   . COPD Brother   . Cancer Other    Allergies  Allergen Reactions  . Gluten Meal     Fatigue/flu-like symptoms.   Prior to Admission medications   Medication Sig Start Date End Date Taking? Authorizing Provider  ARMOUR THYROID 30 MG tablet Take 30 mg by mouth daily before breakfast. 04/09/16  Yes Historical Provider, MD  b complex vitamins capsule Take 1 capsule by mouth daily.   Yes Historical Provider, MD  CALCIUM-MAGNESIUM PO Take 1 tablet by mouth every evening.   Yes Historical Provider, MD  cholecalciferol (VITAMIN D) 1000 units tablet Take 1,000 Units by mouth  daily.   Yes Historical Provider, MD  citalopram (CELEXA) 10 MG tablet Take 10 mg by mouth every evening. 03/28/16  Yes Historical Provider, MD  Multiple Vitamin (MULTIVITAMIN WITH MINERALS) TABS tablet Take 1 tablet by mouth daily.   Yes Historical Provider, MD  tetrahydrozoline 0.05 % ophthalmic solution Place 1-2 drops into both eyes 2 (two) times daily as needed (for eye redness/irritation.).   Yes Historical Provider, MD  traMADol (ULTRAM) 50 MG tablet Take 50 mg by mouth 3 (three) times daily as needed. For pain. 05/27/16  Yes Historical Provider, MD     All other systems have been reviewed and were otherwise negative with the exception of those mentioned in the HPI and as above.  Physical Exam: There were no vitals filed for this visit.  General: Alert, no acute distress Cardiovascular: No pedal edema Respiratory: No cyanosis, no use of accessory musculature Skin: No lesions in the area of chief complaint Neurologic: Sensation intact distally Psychiatric: Patient is competent for consent with normal mood and affect Lymphatic: No axillary or cervical lymphadenopathy  MUSCULOSKELETAL: + TTP at L2  Assessment/Plan: Lumbar 2 compression fracture Plan for Procedure(s): L2 KYPHOPLASTY   Sinclair Ship, MD 06/03/2016 7:02 AM

## 2016-06-03 NOTE — Transfer of Care (Signed)
Immediate Anesthesia Transfer of Care Note  Patient: Shelly Romero  Procedure(s) Performed: Procedure(s) with comments: L 2 KYPHOPLASTY (N/A) - L 2 kyphoplasty  Patient Location: PACU  Anesthesia Type:General  Level of Consciousness: awake, alert , oriented and sedated  Airway & Oxygen Therapy: Patient Spontanous Breathing and Patient connected to nasal cannula oxygen  Post-op Assessment: Report given to RN, Post -op Vital signs reviewed and stable and Patient moving all extremities  Post vital signs: Reviewed and stable  Last Vitals:  Vitals:   06/03/16 0914 06/03/16 1354  BP: (!) 105/41   Pulse: 73   Resp: 18   Temp: 36.7 C (P) 36.4 C    Last Pain:  Vitals:   06/03/16 0925  PainSc: 4       Patients Stated Pain Goal: 1 ([id number])  Complications: No apparent anesthesia complications

## 2016-06-03 NOTE — Anesthesia Postprocedure Evaluation (Signed)
Anesthesia Post Note  Patient: Shelly Romero  Procedure(s) Performed: Procedure(s) (LRB): L 2 KYPHOPLASTY (N/A)  Patient location during evaluation: PACU Anesthesia Type: General Level of consciousness: awake and alert Pain management: pain level controlled Vital Signs Assessment: post-procedure vital signs reviewed and stable Respiratory status: spontaneous breathing, nonlabored ventilation and respiratory function stable Cardiovascular status: blood pressure returned to baseline and stable Postop Assessment: no signs of nausea or vomiting Anesthetic complications: no    Last Vitals:  Vitals:   06/03/16 1530 06/03/16 1555  BP:  99/66  Pulse: 61 62  Resp: 20   Temp:      Last Pain:  Vitals:   06/03/16 1555  PainSc: 2                  Janis Cuffe A

## 2016-06-04 ENCOUNTER — Encounter (HOSPITAL_COMMUNITY): Payer: Self-pay | Admitting: Orthopedic Surgery

## 2016-06-04 NOTE — Anesthesia Postprocedure Evaluation (Signed)
Anesthesia Post Note  Patient: [name]  Procedure(s) Performed: Procedure(s) (LRB): L 2 KYPHOPLASTY (N/A)  Patient location during evaluation: PACU Anesthesia Type: General Level of consciousness: awake and alert Pain management: pain level controlled Vital Signs Assessment: post-procedure vital signs reviewed and stable Respiratory status: spontaneous breathing, nonlabored ventilation, respiratory function stable and patient connected to nasal cannula oxygen Cardiovascular status: blood pressure returned to baseline and stable Postop Assessment: no signs of nausea or vomiting Anesthetic complications: no    Last Vitals:  Vitals:   06/03/16 1530 06/03/16 1555  BP:  99/66  Pulse: 61 62  Resp: 20   Temp:      Last Pain:  Vitals:   06/03/16 1555  PainSc: 2                  Kamariyah Timberlake DAVID

## 2016-06-04 NOTE — Op Note (Signed)
[name], Shelly Romero                ACCOUNT NO.:  [id number]  MEDICAL RECORD NO.:  [id number]  LOCATION:  [facility]                         FACILITY:  [facility]  PHYSICIAN:  Phylliss Bob, MD      DATE OF BIRTH:  [birth year]  DATE OF PROCEDURE:  06/03/2016 DATE OF DISCHARGE:  06/03/2016                              OPERATIVE REPORT   PREOPERATIVE DIAGNOSIS:  Subacute L2 compression fracture.  POSTOPERATIVE DIAGNOSIS:  Subacute L2 compression fracture.  PROCEDURE:  L2 kyphoplasty.  SURGEON:  Phylliss Bob, MD.  ASSISTANT:  None.  ANESTHESIA:  General endotracheal anesthesia.  COMPLICATIONS:  None.  DISPOSITION:  Stable.  ESTIMATED BLOOD LOSS:  Minimal.  INDICATIONS FOR SURGERY:  Briefly, [name] is a pleasant 70 year old female, who did initially present to me on March 25, 2016 with a 3-week history of pain in her low back, occurring after a fall that did occur on February 28, 2016 when she fell down from some steps.  An MRI was reviewed and notable for an L2 compression fracture.  We did go forward with appropriate nonoperative measures including brace, treatment, and medications.  Upon her most recent evaluation, she felt that her pain was not improving.  She felt that her pain was actually worsening.  An updated MRI was reviewed and notable for ongoing edema within the L2 vertebral body.  Given this, we did discuss the pros and cons of surgery, and she did elect to proceed with a kyphoplasty.  OPERATIVE DETAILS:  On June 03, 2016, the patient was brought to surgery and general endotracheal anesthesia was administered.  The patient was placed prone on a well-padded flat Jackson bed.  Gel rolls were placed under the patient's chest and hips.  Antibiotics were given. The back was prepped and draped and AP and lateral fluoroscopy was brought into the field.  I then made small stab incisions just lateral to the L2 pedicles bilaterally.  I then advanced Jamshidi across the L2 pedicles  using a medial to lateral approach.  The tips of the Jamshidi were advanced to approximately the posterior 1/3rd of the vertebral body.  I then drilled and curetted through the cannulas.  I then inserted kyphoplasty balloons.  Of note, the bone was noted to be very sclerotic.  The kyphoplasty balloons did not inflate to any substantial degree.  There was no significant motion of the compressed upper endplate.  When the right kyphoplasty balloon was at approximately 200 units of pressure, it did burst.  The left kyphoplasty balloon was then deflated at this point and the contrast about the region of the balloon was aspirated.  I then introduced approximately 5 mL of cement into the vertebral body in total, half to the left cannula and half to the right. There was good interdigitation of cement, particularly on the right side.  On the left, the cement did accumulate toward the lateral wall. There was no worrisome extravasation of cement either posteriorly toward the spinal canal or anteriorly into the retroperitoneal space.  The cement was then allowed to harden.  The wound was then irrigated.  The wound was then closed using 4 Monocryl.  Bacitracin and a sterile dressing were  applied.  The patient was then awakened from general endotracheal anesthesia and transferred to recovery in stable condition.     Phylliss Bob, MD     MD/MEDQ  D:  06/03/2016  T:  06/04/2016  Job:  [id number]

## 2016-06-04 NOTE — Anesthesia Preprocedure Evaluation (Signed)
Anesthesia Evaluation  Patient identified by MRN, date of birth, ID band Patient awake    Reviewed: Allergy & Precautions, NPO status , Patient's Chart, lab work & pertinent test results  Airway Mallampati: I  TM Distance: >3 FB Neck ROM: Full    Dental   Pulmonary    Pulmonary exam normal        Cardiovascular Normal cardiovascular exam     Neuro/Psych    GI/Hepatic   Endo/Other  Hypothyroidism   Renal/GU      Musculoskeletal   Abdominal   Peds  Hematology   Anesthesia Other Findings   Reproductive/Obstetrics                             Anesthesia Physical Anesthesia Plan  ASA: II  Anesthesia Plan: General   Post-op Pain Management:    Induction: Intravenous  Airway Management Planned: Oral ETT  Additional Equipment:   Intra-op Plan:   Post-operative Plan: Extubation in OR  Informed Consent: I have reviewed the patients History and Physical, chart, labs and discussed the procedure including the risks, benefits and alternatives for the proposed anesthesia with the patient or authorized representative who has indicated his/her understanding and acceptance.     Plan Discussed with: CRNA and Surgeon  Anesthesia Plan Comments:         Anesthesia Quick Evaluation

## 2016-06-16 DIAGNOSIS — Z9889 Other specified postprocedural states: Secondary | ICD-10-CM | POA: Diagnosis not present

## 2016-06-16 DIAGNOSIS — M8588 Other specified disorders of bone density and structure, other site: Secondary | ICD-10-CM | POA: Diagnosis not present

## 2016-07-15 DIAGNOSIS — M6281 Muscle weakness (generalized): Secondary | ICD-10-CM | POA: Diagnosis not present

## 2016-07-15 DIAGNOSIS — M5416 Radiculopathy, lumbar region: Secondary | ICD-10-CM | POA: Diagnosis not present

## 2016-07-15 DIAGNOSIS — M545 Low back pain: Secondary | ICD-10-CM | POA: Diagnosis not present

## 2016-07-26 DIAGNOSIS — M545 Low back pain: Secondary | ICD-10-CM | POA: Diagnosis not present

## 2016-07-26 DIAGNOSIS — M6281 Muscle weakness (generalized): Secondary | ICD-10-CM | POA: Diagnosis not present

## 2016-07-26 DIAGNOSIS — M5416 Radiculopathy, lumbar region: Secondary | ICD-10-CM | POA: Diagnosis not present

## 2016-08-02 DIAGNOSIS — M545 Low back pain: Secondary | ICD-10-CM | POA: Diagnosis not present

## 2016-08-02 DIAGNOSIS — M6281 Muscle weakness (generalized): Secondary | ICD-10-CM | POA: Diagnosis not present

## 2016-08-02 DIAGNOSIS — M5416 Radiculopathy, lumbar region: Secondary | ICD-10-CM | POA: Diagnosis not present

## 2016-08-10 DIAGNOSIS — M533 Sacrococcygeal disorders, not elsewhere classified: Secondary | ICD-10-CM | POA: Diagnosis not present

## 2016-08-27 DIAGNOSIS — Z23 Encounter for immunization: Secondary | ICD-10-CM | POA: Diagnosis not present

## 2016-10-28 DIAGNOSIS — J00 Acute nasopharyngitis [common cold]: Secondary | ICD-10-CM | POA: Diagnosis not present

## 2016-12-23 DIAGNOSIS — J019 Acute sinusitis, unspecified: Secondary | ICD-10-CM | POA: Diagnosis not present

## 2016-12-23 DIAGNOSIS — R109 Unspecified abdominal pain: Secondary | ICD-10-CM | POA: Diagnosis not present

## 2016-12-23 DIAGNOSIS — N390 Urinary tract infection, site not specified: Secondary | ICD-10-CM | POA: Diagnosis not present

## 2016-12-28 DIAGNOSIS — R300 Dysuria: Secondary | ICD-10-CM | POA: Diagnosis not present

## 2016-12-28 DIAGNOSIS — E039 Hypothyroidism, unspecified: Secondary | ICD-10-CM | POA: Diagnosis not present

## 2016-12-30 DIAGNOSIS — M9901 Segmental and somatic dysfunction of cervical region: Secondary | ICD-10-CM | POA: Diagnosis not present

## 2016-12-30 DIAGNOSIS — M48061 Spinal stenosis, lumbar region without neurogenic claudication: Secondary | ICD-10-CM | POA: Diagnosis not present

## 2016-12-30 DIAGNOSIS — M4802 Spinal stenosis, cervical region: Secondary | ICD-10-CM | POA: Diagnosis not present

## 2016-12-30 DIAGNOSIS — M9903 Segmental and somatic dysfunction of lumbar region: Secondary | ICD-10-CM | POA: Diagnosis not present

## 2017-01-03 DIAGNOSIS — M4802 Spinal stenosis, cervical region: Secondary | ICD-10-CM | POA: Diagnosis not present

## 2017-01-03 DIAGNOSIS — M48061 Spinal stenosis, lumbar region without neurogenic claudication: Secondary | ICD-10-CM | POA: Diagnosis not present

## 2017-01-03 DIAGNOSIS — M9903 Segmental and somatic dysfunction of lumbar region: Secondary | ICD-10-CM | POA: Diagnosis not present

## 2017-01-03 DIAGNOSIS — M9901 Segmental and somatic dysfunction of cervical region: Secondary | ICD-10-CM | POA: Diagnosis not present

## 2017-01-05 DIAGNOSIS — N390 Urinary tract infection, site not specified: Secondary | ICD-10-CM | POA: Diagnosis not present

## 2017-01-05 DIAGNOSIS — R3914 Feeling of incomplete bladder emptying: Secondary | ICD-10-CM | POA: Diagnosis not present

## 2017-01-07 DIAGNOSIS — M533 Sacrococcygeal disorders, not elsewhere classified: Secondary | ICD-10-CM | POA: Diagnosis not present

## 2017-01-19 IMAGING — RF DG C-ARM 61-120 MIN
1 series · 1 of 1 positions shown · non-contrast
Comparison: Lumbar spine MRI of May 24, 2016

CLINICAL DATA: L2 kyphoplasty intraoperative studies. Reported
fluoro time is 2 minutes 56 seconds.

EXAM:
DG C-ARM 61-120 MIN; LUMBAR SPINE - 2-3 VIEW

[Series 1: run · 1 of 1 slices shown]
[im 1/1]
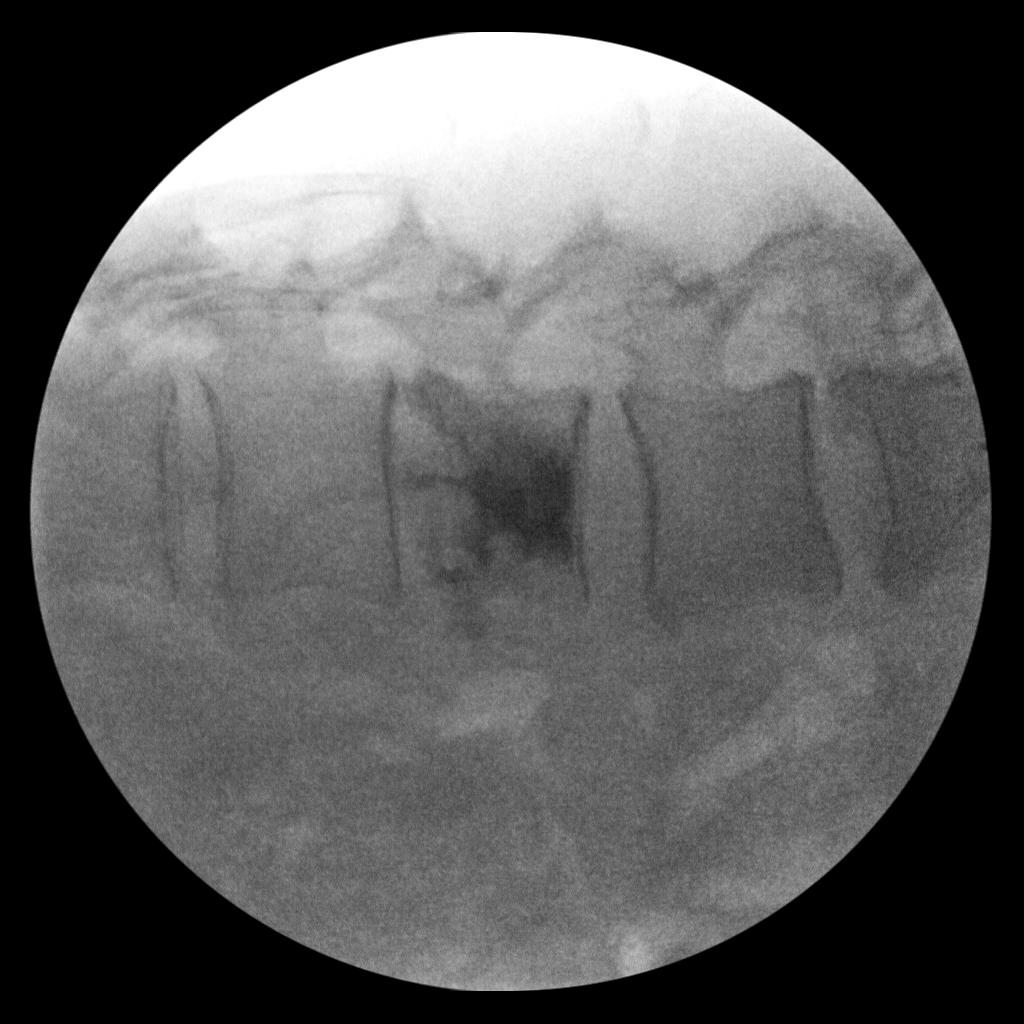

[1 of 1 positions shown; findings below may reference images not displayed]

FINDINGS: AP and lateral fluoro spot images reveal radiodense cement within
the confines of the partially compressed L2 vertebral body. The loss
of height anteriorly appears to be approximately 25-30% and is
stable. 10% or less posterior height loss is observed.
IMPRESSION: Fluoro spot films from a L2 kyphoplasty procedure without evidence
of immediate postprocedure complication.

## 2017-01-19 IMAGING — CR DG CHEST 2V
2 series · 2 of 2 positions shown · non-contrast
Comparison: None.

CLINICAL DATA: Pre-op for lumbar kyphoplasty - hx of Lymphoma,
bronchitis, no known heart issues

EXAM:
CHEST  2 VIEW

[w chest pa]
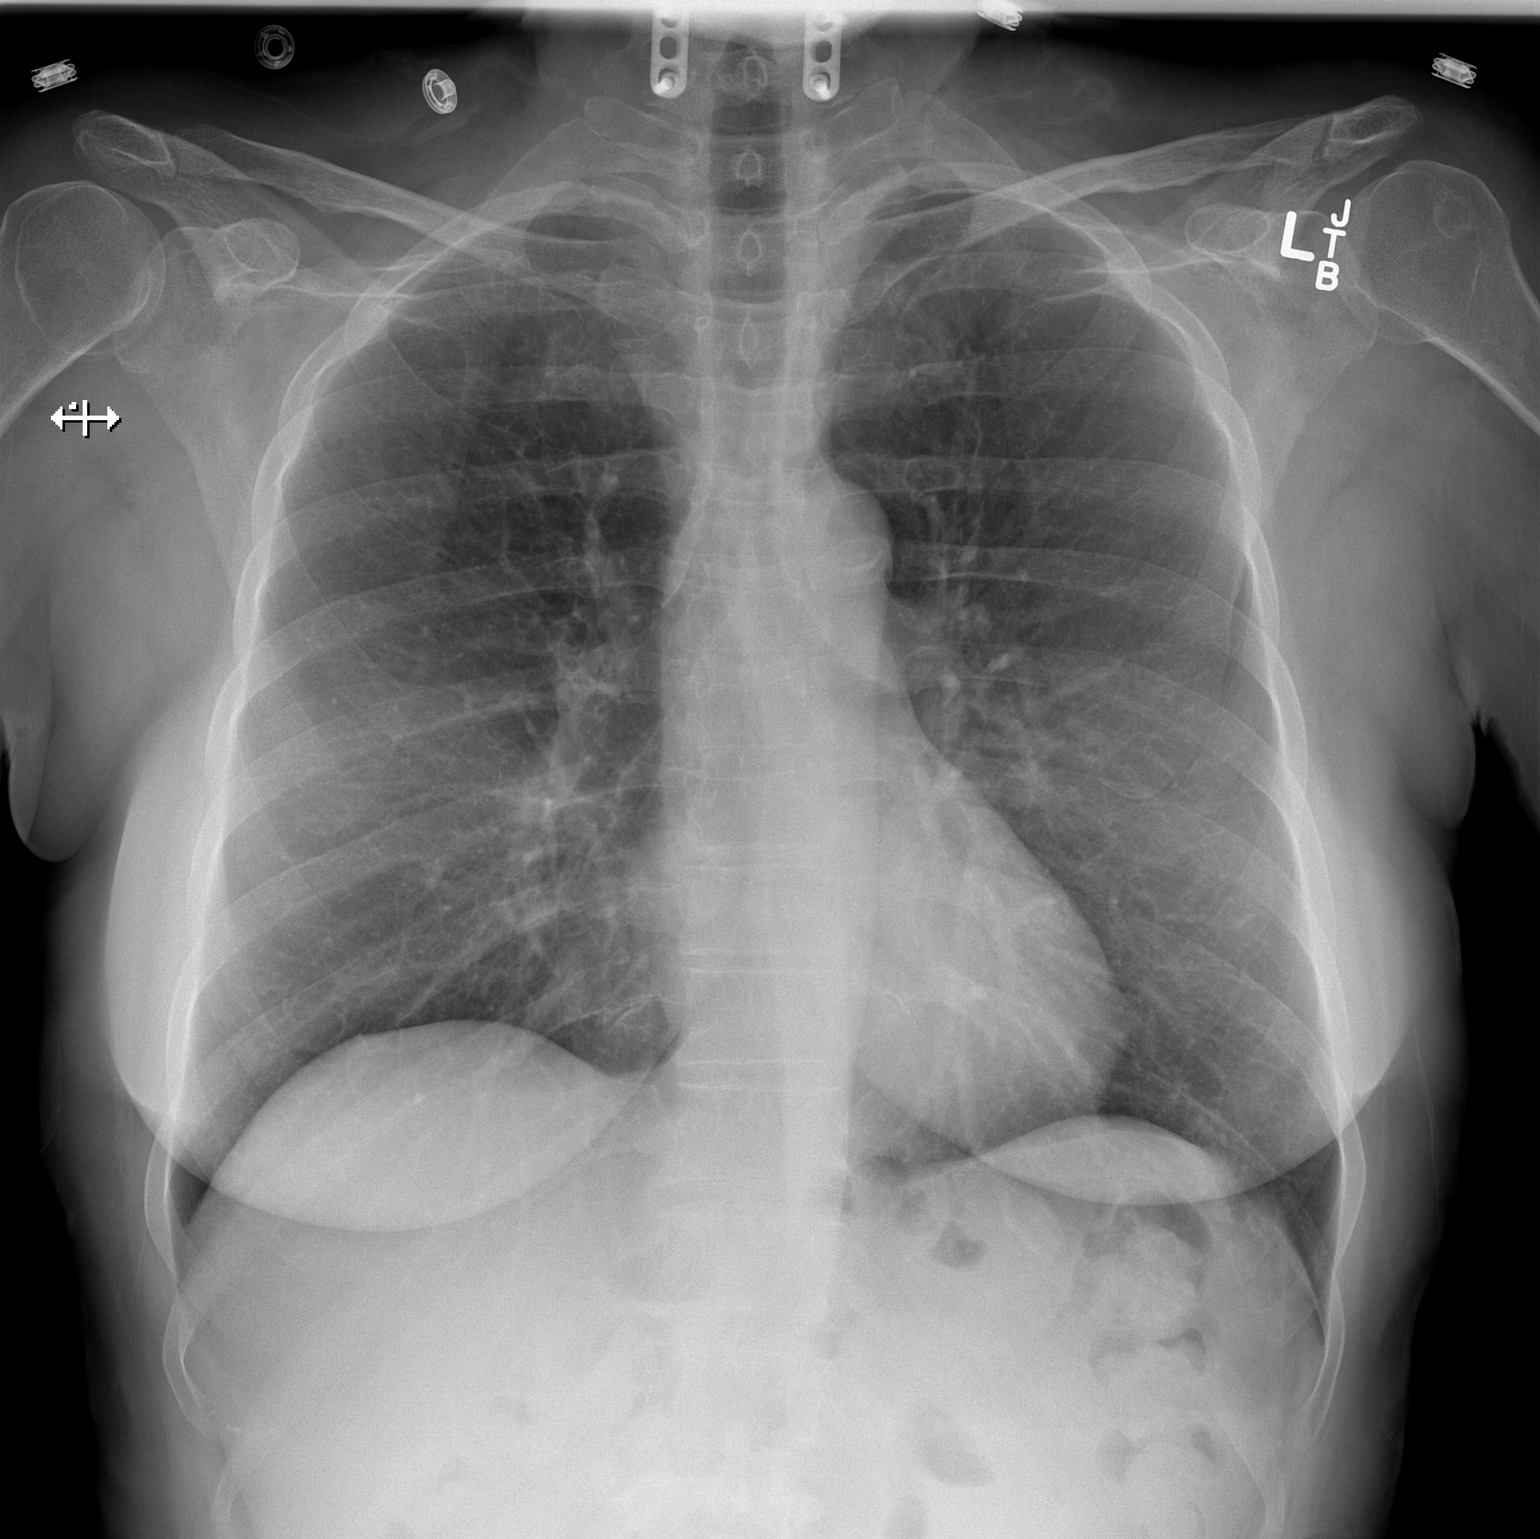

[w chest lat]
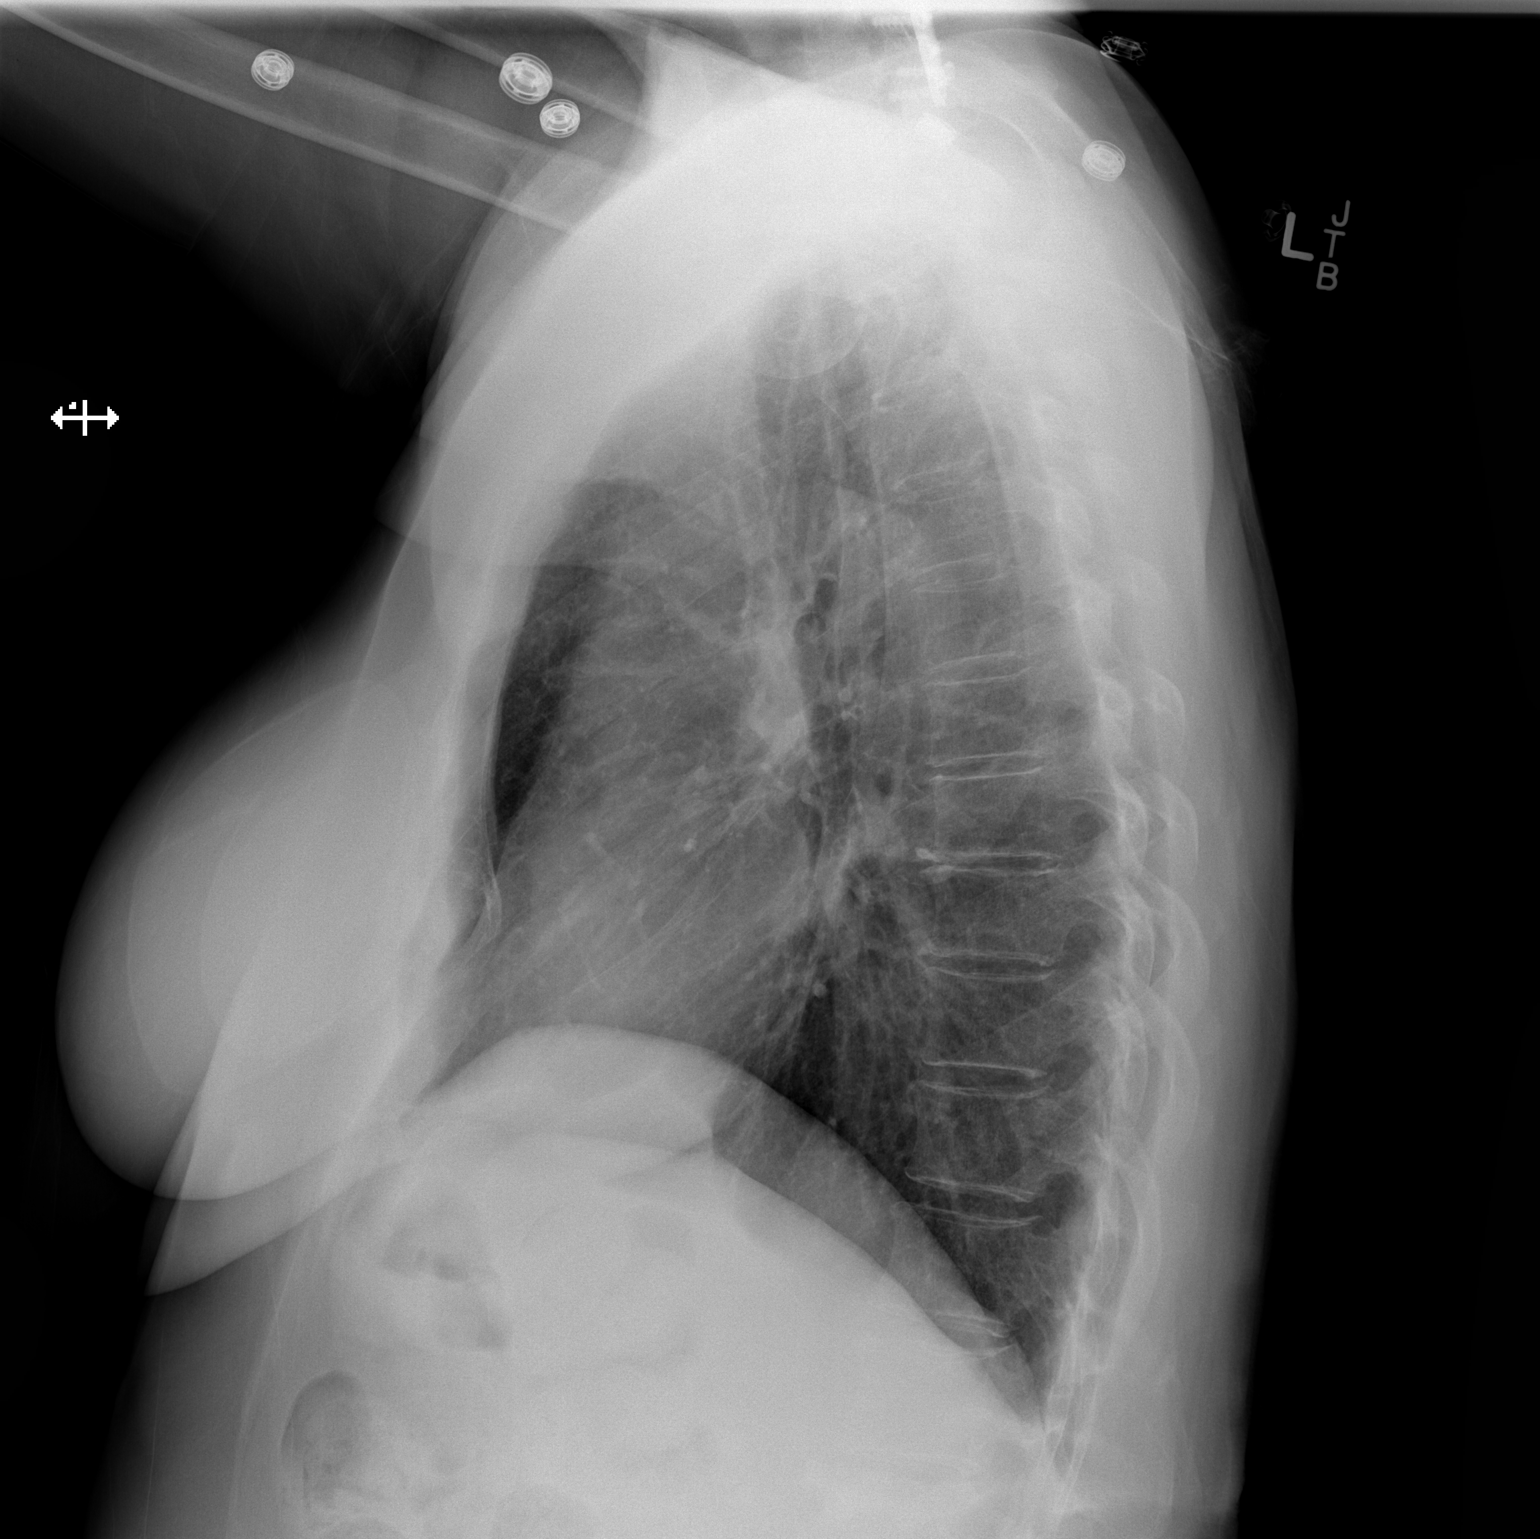

[2 of 2 positions shown; findings below may reference images not displayed]

FINDINGS: Normal heart, mediastinum and hila.

Lungs are clear.  No pleural effusion or pneumothorax.

Skeletal structures are demineralized. No vertebral fracture is
visualized on the included field of view. There has been a previous
posterior cervical spine fusion, incompletely imaged.
IMPRESSION: No active cardiopulmonary disease.

## 2017-01-25 DIAGNOSIS — M533 Sacrococcygeal disorders, not elsewhere classified: Secondary | ICD-10-CM | POA: Diagnosis not present

## 2017-01-26 DIAGNOSIS — N390 Urinary tract infection, site not specified: Secondary | ICD-10-CM | POA: Diagnosis not present

## 2017-01-26 DIAGNOSIS — R319 Hematuria, unspecified: Secondary | ICD-10-CM | POA: Diagnosis not present

## 2017-01-26 DIAGNOSIS — R3914 Feeling of incomplete bladder emptying: Secondary | ICD-10-CM | POA: Diagnosis not present

## 2017-01-26 DIAGNOSIS — N760 Acute vaginitis: Secondary | ICD-10-CM | POA: Diagnosis not present

## 2017-01-26 DIAGNOSIS — Q6101 Congenital single renal cyst: Secondary | ICD-10-CM | POA: Diagnosis not present

## 2017-01-31 DIAGNOSIS — E039 Hypothyroidism, unspecified: Secondary | ICD-10-CM | POA: Diagnosis not present

## 2017-01-31 DIAGNOSIS — F418 Other specified anxiety disorders: Secondary | ICD-10-CM | POA: Diagnosis not present

## 2017-02-07 DIAGNOSIS — M542 Cervicalgia: Secondary | ICD-10-CM | POA: Diagnosis not present

## 2017-02-21 DIAGNOSIS — Z1231 Encounter for screening mammogram for malignant neoplasm of breast: Secondary | ICD-10-CM | POA: Diagnosis not present

## 2017-03-07 DIAGNOSIS — M542 Cervicalgia: Secondary | ICD-10-CM | POA: Diagnosis not present

## 2017-03-11 DIAGNOSIS — M47812 Spondylosis without myelopathy or radiculopathy, cervical region: Secondary | ICD-10-CM | POA: Diagnosis not present

## 2017-03-11 DIAGNOSIS — M791 Myalgia: Secondary | ICD-10-CM | POA: Diagnosis not present

## 2017-03-31 DIAGNOSIS — M9901 Segmental and somatic dysfunction of cervical region: Secondary | ICD-10-CM | POA: Diagnosis not present

## 2017-03-31 DIAGNOSIS — M4804 Spinal stenosis, thoracic region: Secondary | ICD-10-CM | POA: Diagnosis not present

## 2017-03-31 DIAGNOSIS — M9902 Segmental and somatic dysfunction of thoracic region: Secondary | ICD-10-CM | POA: Diagnosis not present

## 2017-03-31 DIAGNOSIS — M4802 Spinal stenosis, cervical region: Secondary | ICD-10-CM | POA: Diagnosis not present

## 2017-04-05 DIAGNOSIS — Z1211 Encounter for screening for malignant neoplasm of colon: Secondary | ICD-10-CM | POA: Diagnosis not present

## 2017-04-05 DIAGNOSIS — H9191 Unspecified hearing loss, right ear: Secondary | ICD-10-CM | POA: Diagnosis not present

## 2017-04-05 DIAGNOSIS — D485 Neoplasm of uncertain behavior of skin: Secondary | ICD-10-CM | POA: Diagnosis not present

## 2017-04-05 DIAGNOSIS — K5909 Other constipation: Secondary | ICD-10-CM | POA: Diagnosis not present

## 2017-04-05 DIAGNOSIS — Z800 Family history of malignant neoplasm of digestive organs: Secondary | ICD-10-CM | POA: Diagnosis not present

## 2017-04-05 DIAGNOSIS — Z86010 Personal history of colonic polyps: Secondary | ICD-10-CM | POA: Diagnosis not present

## 2017-04-05 DIAGNOSIS — H608X1 Other otitis externa, right ear: Secondary | ICD-10-CM | POA: Diagnosis not present

## 2017-04-27 DIAGNOSIS — Z79899 Other long term (current) drug therapy: Secondary | ICD-10-CM | POA: Diagnosis not present

## 2017-04-27 DIAGNOSIS — R5383 Other fatigue: Secondary | ICD-10-CM | POA: Diagnosis not present

## 2017-04-29 DIAGNOSIS — E039 Hypothyroidism, unspecified: Secondary | ICD-10-CM | POA: Diagnosis not present

## 2017-04-29 DIAGNOSIS — Z0000 Encounter for general adult medical examination without abnormal findings: Secondary | ICD-10-CM | POA: Diagnosis not present

## 2017-04-29 DIAGNOSIS — F418 Other specified anxiety disorders: Secondary | ICD-10-CM | POA: Diagnosis not present

## 2017-04-29 DIAGNOSIS — R8299 Other abnormal findings in urine: Secondary | ICD-10-CM | POA: Diagnosis not present

## 2017-04-29 DIAGNOSIS — R5383 Other fatigue: Secondary | ICD-10-CM | POA: Diagnosis not present

## 2017-04-29 DIAGNOSIS — Z79899 Other long term (current) drug therapy: Secondary | ICD-10-CM | POA: Diagnosis not present

## 2017-06-03 DIAGNOSIS — M25551 Pain in right hip: Secondary | ICD-10-CM | POA: Diagnosis not present

## 2017-06-03 DIAGNOSIS — N390 Urinary tract infection, site not specified: Secondary | ICD-10-CM | POA: Diagnosis not present

## 2017-06-03 DIAGNOSIS — Q6101 Congenital single renal cyst: Secondary | ICD-10-CM | POA: Diagnosis not present

## 2017-06-03 DIAGNOSIS — N760 Acute vaginitis: Secondary | ICD-10-CM | POA: Diagnosis not present

## 2017-06-03 DIAGNOSIS — N200 Calculus of kidney: Secondary | ICD-10-CM | POA: Diagnosis not present

## 2017-06-03 DIAGNOSIS — R319 Hematuria, unspecified: Secondary | ICD-10-CM | POA: Diagnosis not present

## 2017-06-03 DIAGNOSIS — R3914 Feeling of incomplete bladder emptying: Secondary | ICD-10-CM | POA: Diagnosis not present

## 2017-06-10 DIAGNOSIS — T8542XA Displacement of breast prosthesis and implant, initial encounter: Secondary | ICD-10-CM | POA: Diagnosis not present

## 2017-06-10 DIAGNOSIS — T8543XA Leakage of breast prosthesis and implant, initial encounter: Secondary | ICD-10-CM | POA: Diagnosis not present

## 2017-06-10 DIAGNOSIS — N644 Mastodynia: Secondary | ICD-10-CM | POA: Diagnosis not present

## 2017-06-20 DIAGNOSIS — T8549XA Other mechanical complication of breast prosthesis and implant, initial encounter: Secondary | ICD-10-CM | POA: Diagnosis not present

## 2017-07-25 DIAGNOSIS — L814 Other melanin hyperpigmentation: Secondary | ICD-10-CM | POA: Diagnosis not present

## 2017-07-25 DIAGNOSIS — L820 Inflamed seborrheic keratosis: Secondary | ICD-10-CM | POA: Diagnosis not present

## 2017-07-25 DIAGNOSIS — D1801 Hemangioma of skin and subcutaneous tissue: Secondary | ICD-10-CM | POA: Diagnosis not present

## 2017-08-24 DIAGNOSIS — M542 Cervicalgia: Secondary | ICD-10-CM | POA: Diagnosis not present

## 2017-08-24 DIAGNOSIS — S92512A Displaced fracture of proximal phalanx of left lesser toe(s), initial encounter for closed fracture: Secondary | ICD-10-CM | POA: Diagnosis not present

## 2017-08-24 DIAGNOSIS — G8929 Other chronic pain: Secondary | ICD-10-CM | POA: Diagnosis not present

## 2017-08-24 DIAGNOSIS — S92502A Displaced unspecified fracture of left lesser toe(s), initial encounter for closed fracture: Secondary | ICD-10-CM | POA: Diagnosis not present

## 2017-09-22 DIAGNOSIS — M4804 Spinal stenosis, thoracic region: Secondary | ICD-10-CM | POA: Diagnosis not present

## 2017-09-22 DIAGNOSIS — M4802 Spinal stenosis, cervical region: Secondary | ICD-10-CM | POA: Diagnosis not present

## 2017-09-22 DIAGNOSIS — M9902 Segmental and somatic dysfunction of thoracic region: Secondary | ICD-10-CM | POA: Diagnosis not present

## 2017-09-22 DIAGNOSIS — M9901 Segmental and somatic dysfunction of cervical region: Secondary | ICD-10-CM | POA: Diagnosis not present

## 2017-10-10 DIAGNOSIS — T8549XD Other mechanical complication of breast prosthesis and implant, subsequent encounter: Secondary | ICD-10-CM | POA: Diagnosis not present

## 2017-10-18 DIAGNOSIS — M436 Torticollis: Secondary | ICD-10-CM | POA: Diagnosis not present

## 2017-10-18 DIAGNOSIS — M542 Cervicalgia: Secondary | ICD-10-CM | POA: Diagnosis not present

## 2017-11-08 DIAGNOSIS — M542 Cervicalgia: Secondary | ICD-10-CM | POA: Diagnosis not present

## 2017-11-10 DIAGNOSIS — M542 Cervicalgia: Secondary | ICD-10-CM | POA: Diagnosis not present

## 2017-11-14 DIAGNOSIS — M542 Cervicalgia: Secondary | ICD-10-CM | POA: Diagnosis not present

## 2017-11-22 DIAGNOSIS — M542 Cervicalgia: Secondary | ICD-10-CM | POA: Diagnosis not present

## 2017-11-24 DIAGNOSIS — M542 Cervicalgia: Secondary | ICD-10-CM | POA: Diagnosis not present

## 2017-11-30 DIAGNOSIS — M542 Cervicalgia: Secondary | ICD-10-CM | POA: Diagnosis not present

## 2017-11-30 DIAGNOSIS — M9901 Segmental and somatic dysfunction of cervical region: Secondary | ICD-10-CM | POA: Diagnosis not present

## 2017-11-30 DIAGNOSIS — M9902 Segmental and somatic dysfunction of thoracic region: Secondary | ICD-10-CM | POA: Diagnosis not present

## 2017-11-30 DIAGNOSIS — M546 Pain in thoracic spine: Secondary | ICD-10-CM | POA: Diagnosis not present

## 2017-12-02 DIAGNOSIS — M546 Pain in thoracic spine: Secondary | ICD-10-CM | POA: Diagnosis not present

## 2017-12-02 DIAGNOSIS — M9902 Segmental and somatic dysfunction of thoracic region: Secondary | ICD-10-CM | POA: Diagnosis not present

## 2017-12-02 DIAGNOSIS — M9901 Segmental and somatic dysfunction of cervical region: Secondary | ICD-10-CM | POA: Diagnosis not present

## 2017-12-02 DIAGNOSIS — M542 Cervicalgia: Secondary | ICD-10-CM | POA: Diagnosis not present

## 2017-12-05 DIAGNOSIS — M542 Cervicalgia: Secondary | ICD-10-CM | POA: Diagnosis not present

## 2017-12-06 DIAGNOSIS — M542 Cervicalgia: Secondary | ICD-10-CM | POA: Diagnosis not present

## 2017-12-08 DIAGNOSIS — M542 Cervicalgia: Secondary | ICD-10-CM | POA: Diagnosis not present

## 2017-12-09 DIAGNOSIS — N951 Menopausal and female climacteric states: Secondary | ICD-10-CM | POA: Diagnosis not present

## 2017-12-09 DIAGNOSIS — E039 Hypothyroidism, unspecified: Secondary | ICD-10-CM | POA: Diagnosis not present

## 2017-12-14 DIAGNOSIS — M542 Cervicalgia: Secondary | ICD-10-CM | POA: Diagnosis not present

## 2017-12-16 DIAGNOSIS — M542 Cervicalgia: Secondary | ICD-10-CM | POA: Diagnosis not present

## 2017-12-27 DIAGNOSIS — M542 Cervicalgia: Secondary | ICD-10-CM | POA: Diagnosis not present

## 2018-01-03 DIAGNOSIS — S134XXA Sprain of ligaments of cervical spine, initial encounter: Secondary | ICD-10-CM | POA: Diagnosis not present

## 2018-01-05 DIAGNOSIS — M542 Cervicalgia: Secondary | ICD-10-CM | POA: Diagnosis not present

## 2018-02-01 DIAGNOSIS — Z0000 Encounter for general adult medical examination without abnormal findings: Secondary | ICD-10-CM | POA: Diagnosis not present

## 2018-02-01 DIAGNOSIS — Z87440 Personal history of urinary (tract) infections: Secondary | ICD-10-CM | POA: Diagnosis not present

## 2018-02-01 DIAGNOSIS — Z86010 Personal history of colonic polyps: Secondary | ICD-10-CM | POA: Diagnosis not present

## 2018-02-01 DIAGNOSIS — N390 Urinary tract infection, site not specified: Secondary | ICD-10-CM | POA: Diagnosis not present

## 2018-02-01 DIAGNOSIS — M8580 Other specified disorders of bone density and structure, unspecified site: Secondary | ICD-10-CM | POA: Diagnosis not present

## 2018-02-01 DIAGNOSIS — E039 Hypothyroidism, unspecified: Secondary | ICD-10-CM | POA: Diagnosis not present

## 2018-02-01 DIAGNOSIS — Z8572 Personal history of non-Hodgkin lymphomas: Secondary | ICD-10-CM | POA: Diagnosis not present

## 2018-02-01 DIAGNOSIS — M542 Cervicalgia: Secondary | ICD-10-CM | POA: Diagnosis not present

## 2018-02-06 DIAGNOSIS — M542 Cervicalgia: Secondary | ICD-10-CM | POA: Diagnosis not present

## 2018-02-09 DIAGNOSIS — M542 Cervicalgia: Secondary | ICD-10-CM | POA: Diagnosis not present

## 2018-03-13 DIAGNOSIS — S134XXA Sprain of ligaments of cervical spine, initial encounter: Secondary | ICD-10-CM | POA: Diagnosis not present

## 2018-03-17 DIAGNOSIS — M542 Cervicalgia: Secondary | ICD-10-CM | POA: Diagnosis not present

## 2018-03-17 DIAGNOSIS — E039 Hypothyroidism, unspecified: Secondary | ICD-10-CM | POA: Diagnosis not present

## 2018-03-24 DIAGNOSIS — M542 Cervicalgia: Secondary | ICD-10-CM | POA: Diagnosis not present

## 2018-04-26 DIAGNOSIS — M5481 Occipital neuralgia: Secondary | ICD-10-CM | POA: Diagnosis not present

## 2018-04-26 DIAGNOSIS — M545 Low back pain: Secondary | ICD-10-CM | POA: Diagnosis not present

## 2018-05-08 DIAGNOSIS — M461 Sacroiliitis, not elsewhere classified: Secondary | ICD-10-CM | POA: Diagnosis not present

## 2018-05-10 DIAGNOSIS — L538 Other specified erythematous conditions: Secondary | ICD-10-CM | POA: Diagnosis not present

## 2018-05-10 DIAGNOSIS — L578 Other skin changes due to chronic exposure to nonionizing radiation: Secondary | ICD-10-CM | POA: Diagnosis not present

## 2018-05-10 DIAGNOSIS — L820 Inflamed seborrheic keratosis: Secondary | ICD-10-CM | POA: Diagnosis not present

## 2018-05-10 DIAGNOSIS — R208 Other disturbances of skin sensation: Secondary | ICD-10-CM | POA: Diagnosis not present

## 2018-05-10 DIAGNOSIS — D225 Melanocytic nevi of trunk: Secondary | ICD-10-CM | POA: Diagnosis not present

## 2018-05-10 DIAGNOSIS — L853 Xerosis cutis: Secondary | ICD-10-CM | POA: Diagnosis not present

## 2018-05-10 DIAGNOSIS — L298 Other pruritus: Secondary | ICD-10-CM | POA: Diagnosis not present

## 2018-05-10 DIAGNOSIS — D1801 Hemangioma of skin and subcutaneous tissue: Secondary | ICD-10-CM | POA: Diagnosis not present

## 2018-05-15 DIAGNOSIS — M5481 Occipital neuralgia: Secondary | ICD-10-CM | POA: Diagnosis not present

## 2018-05-22 DIAGNOSIS — K6389 Other specified diseases of intestine: Secondary | ICD-10-CM | POA: Diagnosis not present

## 2018-05-22 DIAGNOSIS — D125 Benign neoplasm of sigmoid colon: Secondary | ICD-10-CM | POA: Diagnosis not present

## 2018-05-22 DIAGNOSIS — K635 Polyp of colon: Secondary | ICD-10-CM | POA: Diagnosis not present

## 2018-05-22 DIAGNOSIS — Z8719 Personal history of other diseases of the digestive system: Secondary | ICD-10-CM | POA: Diagnosis not present

## 2018-05-22 DIAGNOSIS — Z1211 Encounter for screening for malignant neoplasm of colon: Secondary | ICD-10-CM | POA: Diagnosis not present

## 2018-05-22 DIAGNOSIS — Z86010 Personal history of colonic polyps: Secondary | ICD-10-CM | POA: Diagnosis not present

## 2018-05-22 DIAGNOSIS — Z800 Family history of malignant neoplasm of digestive organs: Secondary | ICD-10-CM | POA: Diagnosis not present

## 2018-06-13 DIAGNOSIS — M9903 Segmental and somatic dysfunction of lumbar region: Secondary | ICD-10-CM | POA: Diagnosis not present

## 2018-06-13 DIAGNOSIS — M5408 Panniculitis affecting regions of neck and back, sacral and sacrococcygeal region: Secondary | ICD-10-CM | POA: Diagnosis not present

## 2018-06-13 DIAGNOSIS — M62830 Muscle spasm of back: Secondary | ICD-10-CM | POA: Diagnosis not present

## 2018-06-13 DIAGNOSIS — M9904 Segmental and somatic dysfunction of sacral region: Secondary | ICD-10-CM | POA: Diagnosis not present

## 2018-06-15 DIAGNOSIS — M5408 Panniculitis affecting regions of neck and back, sacral and sacrococcygeal region: Secondary | ICD-10-CM | POA: Diagnosis not present

## 2018-06-15 DIAGNOSIS — M62830 Muscle spasm of back: Secondary | ICD-10-CM | POA: Diagnosis not present

## 2018-06-15 DIAGNOSIS — M9903 Segmental and somatic dysfunction of lumbar region: Secondary | ICD-10-CM | POA: Diagnosis not present

## 2018-06-15 DIAGNOSIS — M9904 Segmental and somatic dysfunction of sacral region: Secondary | ICD-10-CM | POA: Diagnosis not present

## 2018-06-26 DIAGNOSIS — M62830 Muscle spasm of back: Secondary | ICD-10-CM | POA: Diagnosis not present

## 2018-06-26 DIAGNOSIS — M5408 Panniculitis affecting regions of neck and back, sacral and sacrococcygeal region: Secondary | ICD-10-CM | POA: Diagnosis not present

## 2018-06-26 DIAGNOSIS — M9904 Segmental and somatic dysfunction of sacral region: Secondary | ICD-10-CM | POA: Diagnosis not present

## 2018-06-26 DIAGNOSIS — M9903 Segmental and somatic dysfunction of lumbar region: Secondary | ICD-10-CM | POA: Diagnosis not present

## 2018-06-29 DIAGNOSIS — M9904 Segmental and somatic dysfunction of sacral region: Secondary | ICD-10-CM | POA: Diagnosis not present

## 2018-06-29 DIAGNOSIS — M62830 Muscle spasm of back: Secondary | ICD-10-CM | POA: Diagnosis not present

## 2018-06-29 DIAGNOSIS — M9903 Segmental and somatic dysfunction of lumbar region: Secondary | ICD-10-CM | POA: Diagnosis not present

## 2018-06-29 DIAGNOSIS — M5408 Panniculitis affecting regions of neck and back, sacral and sacrococcygeal region: Secondary | ICD-10-CM | POA: Diagnosis not present

## 2018-07-03 DIAGNOSIS — M62830 Muscle spasm of back: Secondary | ICD-10-CM | POA: Diagnosis not present

## 2018-07-03 DIAGNOSIS — M9904 Segmental and somatic dysfunction of sacral region: Secondary | ICD-10-CM | POA: Diagnosis not present

## 2018-07-03 DIAGNOSIS — M5408 Panniculitis affecting regions of neck and back, sacral and sacrococcygeal region: Secondary | ICD-10-CM | POA: Diagnosis not present

## 2018-07-03 DIAGNOSIS — M9903 Segmental and somatic dysfunction of lumbar region: Secondary | ICD-10-CM | POA: Diagnosis not present

## 2018-07-14 DIAGNOSIS — G8929 Other chronic pain: Secondary | ICD-10-CM | POA: Diagnosis not present

## 2018-07-14 DIAGNOSIS — R300 Dysuria: Secondary | ICD-10-CM | POA: Diagnosis not present

## 2018-07-14 DIAGNOSIS — Z87440 Personal history of urinary (tract) infections: Secondary | ICD-10-CM | POA: Diagnosis not present

## 2018-07-14 DIAGNOSIS — M25551 Pain in right hip: Secondary | ICD-10-CM | POA: Diagnosis not present

## 2018-07-25 DIAGNOSIS — M25551 Pain in right hip: Secondary | ICD-10-CM | POA: Diagnosis not present

## 2018-07-25 DIAGNOSIS — M549 Dorsalgia, unspecified: Secondary | ICD-10-CM | POA: Diagnosis not present

## 2018-07-25 DIAGNOSIS — M545 Low back pain: Secondary | ICD-10-CM | POA: Diagnosis not present

## 2018-07-25 DIAGNOSIS — M1611 Unilateral primary osteoarthritis, right hip: Secondary | ICD-10-CM | POA: Diagnosis not present

## 2018-07-26 DIAGNOSIS — R300 Dysuria: Secondary | ICD-10-CM | POA: Diagnosis not present

## 2018-08-08 DIAGNOSIS — M62830 Muscle spasm of back: Secondary | ICD-10-CM | POA: Diagnosis not present

## 2018-08-08 DIAGNOSIS — M9903 Segmental and somatic dysfunction of lumbar region: Secondary | ICD-10-CM | POA: Diagnosis not present

## 2018-08-08 DIAGNOSIS — M5408 Panniculitis affecting regions of neck and back, sacral and sacrococcygeal region: Secondary | ICD-10-CM | POA: Diagnosis not present

## 2018-08-08 DIAGNOSIS — M9904 Segmental and somatic dysfunction of sacral region: Secondary | ICD-10-CM | POA: Diagnosis not present

## 2018-08-17 DIAGNOSIS — M5408 Panniculitis affecting regions of neck and back, sacral and sacrococcygeal region: Secondary | ICD-10-CM | POA: Diagnosis not present

## 2018-08-17 DIAGNOSIS — M62830 Muscle spasm of back: Secondary | ICD-10-CM | POA: Diagnosis not present

## 2018-08-17 DIAGNOSIS — M9903 Segmental and somatic dysfunction of lumbar region: Secondary | ICD-10-CM | POA: Diagnosis not present

## 2018-08-17 DIAGNOSIS — M9904 Segmental and somatic dysfunction of sacral region: Secondary | ICD-10-CM | POA: Diagnosis not present

## 2018-08-29 DIAGNOSIS — M9903 Segmental and somatic dysfunction of lumbar region: Secondary | ICD-10-CM | POA: Diagnosis not present

## 2018-08-29 DIAGNOSIS — M5408 Panniculitis affecting regions of neck and back, sacral and sacrococcygeal region: Secondary | ICD-10-CM | POA: Diagnosis not present

## 2018-08-29 DIAGNOSIS — M9904 Segmental and somatic dysfunction of sacral region: Secondary | ICD-10-CM | POA: Diagnosis not present

## 2018-08-29 DIAGNOSIS — M62830 Muscle spasm of back: Secondary | ICD-10-CM | POA: Diagnosis not present
